# Patient Record
Sex: Female | Born: 1940
Health system: Southern US, Community
[De-identification: ages and names within clinical notes are randomized; demographics above are authoritative.]

## PROBLEM LIST (undated history)

## (undated) DIAGNOSIS — I890 Lymphedema, not elsewhere classified: Secondary | ICD-10-CM

## (undated) DIAGNOSIS — J189 Pneumonia, unspecified organism: Secondary | ICD-10-CM

## (undated) DIAGNOSIS — E785 Hyperlipidemia, unspecified: Secondary | ICD-10-CM

## (undated) DIAGNOSIS — Z9889 Other specified postprocedural states: Secondary | ICD-10-CM

## (undated) DIAGNOSIS — G4730 Sleep apnea, unspecified: Secondary | ICD-10-CM

## (undated) DIAGNOSIS — K759 Inflammatory liver disease, unspecified: Secondary | ICD-10-CM

## (undated) DIAGNOSIS — Z972 Presence of dental prosthetic device (complete) (partial): Secondary | ICD-10-CM

## (undated) DIAGNOSIS — R112 Nausea with vomiting, unspecified: Secondary | ICD-10-CM

## (undated) DIAGNOSIS — I10 Essential (primary) hypertension: Secondary | ICD-10-CM

## (undated) DIAGNOSIS — J441 Chronic obstructive pulmonary disease with (acute) exacerbation: Secondary | ICD-10-CM

## (undated) DIAGNOSIS — IMO0001 Reserved for inherently not codable concepts without codable children: Secondary | ICD-10-CM

## (undated) DIAGNOSIS — I272 Pulmonary hypertension, unspecified: Secondary | ICD-10-CM

## (undated) DIAGNOSIS — R011 Cardiac murmur, unspecified: Secondary | ICD-10-CM

## (undated) DIAGNOSIS — J9600 Acute respiratory failure, unspecified whether with hypoxia or hypercapnia: Secondary | ICD-10-CM

## (undated) HISTORY — PX: EYE SURGERY: SHX253

## (undated) HISTORY — PX: ABDOMINAL HYSTERECTOMY: SHX81

## (undated) HISTORY — PX: APPENDECTOMY: SHX54

## (undated) HISTORY — PX: BREAST SURGERY: SHX581

## (undated) HISTORY — PX: TONSILLECTOMY: SUR1361

---

## 2005-02-28 ENCOUNTER — Ambulatory Visit: Payer: Self-pay | Admitting: Internal Medicine

## 2005-07-11 ENCOUNTER — Ambulatory Visit: Payer: Self-pay | Admitting: Unknown Physician Specialty

## 2006-05-13 ENCOUNTER — Ambulatory Visit: Payer: Self-pay | Admitting: Internal Medicine

## 2007-05-17 ENCOUNTER — Ambulatory Visit: Payer: Self-pay | Admitting: Internal Medicine

## 2011-11-17 ENCOUNTER — Ambulatory Visit: Payer: Self-pay | Admitting: Internal Medicine

## 2012-11-17 ENCOUNTER — Ambulatory Visit: Payer: Self-pay | Admitting: Internal Medicine

## 2013-03-10 ENCOUNTER — Ambulatory Visit: Payer: Self-pay | Admitting: Ophthalmology

## 2013-03-11 ENCOUNTER — Ambulatory Visit: Payer: Self-pay | Admitting: Ophthalmology

## 2013-07-18 ENCOUNTER — Ambulatory Visit: Payer: Self-pay | Admitting: Ophthalmology

## 2014-04-19 DIAGNOSIS — R7309 Other abnormal glucose: Secondary | ICD-10-CM | POA: Insufficient documentation

## 2014-11-20 ENCOUNTER — Ambulatory Visit
Admit: 2014-11-20 | Disposition: A | Discharge status: Home / Self Care | Payer: Self-pay | Attending: Internal Medicine | Admitting: Internal Medicine

## 2014-11-24 NOTE — Op Note (Signed)
PATIENT NAME:  Vanessa DolinMANESS, Dona Y MR#:  [id number] DATE OF BIRTH:  Sep 27, 1940  DATE OF PROCEDURE:  03/11/2013  PREOPERATIVE DIAGNOSIS: Rhegmatogenous retinal detachment of the right eye.   POSTOPERATIVE DIAGNOSIS: Rhegmatogenous retinal detachment of the right eye.   PROCEDURES PERFORMED:  1.  Pars plana vitrectomy of the right eye.  2.  Endolaser of the right eye.  3.  Gas exchange of the right eye.  4.  Retinal detachment repair of the right eye.   PRIMARY SURGEON:  Aron BabaMatthew Shan Padgett, MD  ESTIMATED BLOOD LOSS: Less than 1 mL.  ANESTHESIA: Retrobulbar block of the right eye with monitored anesthesia care.   COMPLICATIONS: None.   ESTIMATED BLOOD LOSS: Less than 1 mL.   INDICATION FOR PROCEDURE: This is a patient who presented to my office with loss of vision in her left eye 2 days prior. Examination revealed a macula off rhegmatogenous retinal detachment of the right eye. Risks, benefits and alternatives of the above procedure were discussed and the patient wished to proceed.   DETAILS OF PROCEDURE: After informed consent was obtained, the patient was brought into the operative suite at [facility]. The patient was placed in the supine position and was given a small dose of Alfenta and a retrobulbar block was performed on the right eye by the primary surgeon without any complications. The right eye was prepped and draped in sterile manner. After lid speculum was inserted, a 25-gauge trocar was placed inferotemporally through displaced conjunctiva in an oblique fashion 4 mm beyond the limbus. Infusion cannula was turned on and inserted through the trocar and secured into position with Steri-Strips. Two more trocars were placed in a similar fashion superotemporally and superonasally. The vitreous cutter and light pipe were introduced into the eye and a core vitrectomy was performed. Peripheral vitreous was trimmed for 360 degrees with special care taken over the retinal  detachment in order to avoid incarceration or cuts of the retina. Only a single retinal break could be identified at approximately 11 o'clock. This was marked using endo cautery. Once the peripheral vitreous was trimmed for 360 degrees with care taken to avoid hitting the crystalline lens, posterior draining retinotomy was created at approximately 10:30. An air-fluid exchange was performed through this draining retinotomy and the retina completely flattened. Four rows of laser were placed around the peripheral retinal tear and 3 to 4 rows of laser was carried for 360 degrees, from the ora serrata posteriorly. Once this was completed, remnant fluid was removed from the posterior and through the draining retinotomy. Four rows of laser were placed around the draining retinotomy. 22% SF6 was used as an air gas exchange and the trocars were removed. Two of the trocar sites were closed using 6-0 plain gut. All the wounds were noted to be airtight, and the eye was pressurized to 15 mmHg with the SF6. 5 mg of dexamethasone was given into the inferior fornix, and the lid speculum was removed. The eye was cleaned and TobraDex was placed on the eye. A patch and shield were placed over the eye, and the patient was taken to postanesthesia care with instructions to remain head up with a left tilt.  ____________________________ Ignacia FellingMatthew F. Champ MungoAppenzeller, MD mfa:sb D: 03/11/2013 08:18:50 ET T: 03/11/2013 08:58:23 ET JOB#: [id number]  cc: Ignacia FellingMatthew F. Champ MungoAppenzeller, MD, <Dictator> Cline CoolsMATTHEW F Wynell Halberg MD ELECTRONICALLY SIGNED 03/23/2013 8:48

## 2015-01-03 ENCOUNTER — Inpatient Hospital Stay
Admission: AD | Admit: 2015-01-03 | Discharge: 2015-01-06 | DRG: 189 | Disposition: A | Discharge status: Home / Self Care | Payer: PPO | Source: Ambulatory Visit | Attending: Internal Medicine | Admitting: Internal Medicine

## 2015-01-03 ENCOUNTER — Encounter: Payer: Self-pay | Admitting: Internal Medicine

## 2015-01-03 ENCOUNTER — Inpatient Hospital Stay: Payer: PPO

## 2015-01-03 DIAGNOSIS — J9600 Acute respiratory failure, unspecified whether with hypoxia or hypercapnia: Secondary | ICD-10-CM | POA: Diagnosis present

## 2015-01-03 DIAGNOSIS — I272 Other secondary pulmonary hypertension: Secondary | ICD-10-CM | POA: Diagnosis present

## 2015-01-03 DIAGNOSIS — I10 Essential (primary) hypertension: Secondary | ICD-10-CM | POA: Diagnosis present

## 2015-01-03 DIAGNOSIS — R000 Tachycardia, unspecified: Secondary | ICD-10-CM | POA: Diagnosis present

## 2015-01-03 DIAGNOSIS — Z8249 Family history of ischemic heart disease and other diseases of the circulatory system: Secondary | ICD-10-CM | POA: Diagnosis not present

## 2015-01-03 DIAGNOSIS — J441 Chronic obstructive pulmonary disease with (acute) exacerbation: Secondary | ICD-10-CM | POA: Insufficient documentation

## 2015-01-03 DIAGNOSIS — G4730 Sleep apnea, unspecified: Secondary | ICD-10-CM | POA: Diagnosis present

## 2015-01-03 DIAGNOSIS — Z87891 Personal history of nicotine dependence: Secondary | ICD-10-CM | POA: Diagnosis not present

## 2015-01-03 DIAGNOSIS — E785 Hyperlipidemia, unspecified: Secondary | ICD-10-CM | POA: Diagnosis present

## 2015-01-03 DIAGNOSIS — R0600 Dyspnea, unspecified: Secondary | ICD-10-CM

## 2015-01-03 HISTORY — DX: Chronic obstructive pulmonary disease with (acute) exacerbation: J44.1

## 2015-01-03 HISTORY — DX: Essential (primary) hypertension: I10

## 2015-01-03 HISTORY — DX: Acute respiratory failure, unspecified whether with hypoxia or hypercapnia: J96.00

## 2015-01-03 HISTORY — DX: Hyperlipidemia, unspecified: E78.5

## 2015-01-03 HISTORY — DX: Other specified postprocedural states: R11.2

## 2015-01-03 HISTORY — DX: Cardiac murmur, unspecified: R01.1

## 2015-01-03 HISTORY — DX: Pneumonia, unspecified organism: J18.9

## 2015-01-03 HISTORY — DX: Sleep apnea, unspecified: G47.30

## 2015-01-03 HISTORY — DX: Pulmonary hypertension, unspecified: I27.20

## 2015-01-03 HISTORY — DX: Other specified postprocedural states: Z98.890

## 2015-01-03 HISTORY — DX: Inflammatory liver disease, unspecified: K75.9

## 2015-01-03 HISTORY — DX: Reserved for inherently not codable concepts without codable children: IMO0001

## 2015-01-03 LAB — URINALYSIS COMPLETE WITH MICROSCOPIC (ARMC ONLY)
BACTERIA UA: NONE SEEN
Bilirubin Urine: NEGATIVE
GLUCOSE, UA: NEGATIVE mg/dL
KETONES UR: NEGATIVE mg/dL
NITRITE: NEGATIVE
Protein, ur: NEGATIVE mg/dL
SPECIFIC GRAVITY, URINE: 1.008 (ref 1.005–1.030)
pH: 6 (ref 5.0–8.0)

## 2015-01-03 LAB — COMPREHENSIVE METABOLIC PANEL
ALT: 19 U/L (ref 14–54)
AST: 23 U/L (ref 15–41)
Albumin: 4.2 g/dL (ref 3.5–5.0)
Alkaline Phosphatase: 81 U/L (ref 38–126)
Anion gap: 12 (ref 5–15)
BILIRUBIN TOTAL: 0.6 mg/dL (ref 0.3–1.2)
BUN: 14 mg/dL (ref 6–20)
CALCIUM: 10.3 mg/dL (ref 8.9–10.3)
CHLORIDE: 94 mmol/L — AB (ref 101–111)
CO2: 31 mmol/L (ref 22–32)
CREATININE: 0.79 mg/dL (ref 0.44–1.00)
GFR calc non Af Amer: >60 mL/min (ref >60)
Glucose, Bld: 114 mg/dL — ABNORMAL HIGH (ref 65–99)
Potassium: 3.4 mmol/L — ABNORMAL LOW (ref 3.5–5.1)
Sodium: 137 mmol/L (ref 135–145)
TOTAL PROTEIN: 8 g/dL (ref 6.5–8.1)

## 2015-01-03 LAB — CBC
HCT: 39.9 % (ref 35.0–47.0)
HEMOGLOBIN: 13.4 g/dL (ref 12.0–16.0)
MCH: 31.2 pg (ref 26.0–34.0)
MCHC: 33.5 g/dL (ref 32.0–36.0)
MCV: 93.3 fL (ref 80.0–100.0)
Platelets: 291 10*3/uL (ref 150–440)
RBC: 4.28 MIL/uL (ref 3.80–5.20)
RDW: 12.5 % (ref 11.5–14.5)
WBC: 7.7 10*3/uL (ref 3.6–11.0)

## 2015-01-03 LAB — TROPONIN I: Troponin I: <0.03 ng/mL (ref <0.031)

## 2015-01-03 MED ORDER — ONDANSETRON HCL 4 MG/2ML IJ SOLN: 4.0000 mg | Freq: Four times a day (QID) | INTRAMUSCULAR | Status: DC | PRN

## 2015-01-03 MED ORDER — PRAVASTATIN SODIUM 20 MG PO TABS
20.0000 mg | ORAL_TABLET | Freq: Every day | ORAL | Status: DC
  Administered 2015-01-03 – 2015-01-05 (×3): 20 mg via ORAL
  Filled 2015-01-03 (×3): qty 1

## 2015-01-03 MED ORDER — SODIUM CHLORIDE 0.9 % IV SOLN
INTRAVENOUS | Status: DC
  Administered 2015-01-03: 19:00:00 via INTRAVENOUS

## 2015-01-03 MED ORDER — IPRATROPIUM BROMIDE 0.02 % IN SOLN
0.5000 mg | Freq: Four times a day (QID) | RESPIRATORY_TRACT | Status: DC
  Administered 2015-01-03 – 2015-01-05 (×6): 0.5 mg via RESPIRATORY_TRACT
  Filled 2015-01-03 (×6): qty 2.5

## 2015-01-03 MED ORDER — ONDANSETRON HCL 4 MG PO TABS: 4.0000 mg | ORAL_TABLET | Freq: Four times a day (QID) | ORAL | Status: DC | PRN

## 2015-01-03 MED ORDER — BUDESONIDE-FORMOTEROL FUMARATE 160-4.5 MCG/ACT IN AERO
2.0000 | INHALATION_SPRAY | Freq: Two times a day (BID) | RESPIRATORY_TRACT | Status: DC
  Administered 2015-01-06: 09:00:00 2 via RESPIRATORY_TRACT
  Filled 2015-01-03: qty 6

## 2015-01-03 MED ORDER — METHYLPREDNISOLONE SODIUM SUCC 40 MG IJ SOLR
40.0000 mg | Freq: Two times a day (BID) | INTRAMUSCULAR | Status: DC
  Administered 2015-01-03 – 2015-01-05 (×4): 40 mg via INTRAVENOUS
  Filled 2015-01-03 (×4): qty 1

## 2015-01-03 MED ORDER — LOSARTAN POTASSIUM 50 MG PO TABS
25.0000 mg | ORAL_TABLET | Freq: Every day | ORAL | Status: DC
  Administered 2015-01-03 – 2015-01-06 (×4): 25 mg via ORAL
  Filled 2015-01-03 (×4): qty 1

## 2015-01-03 MED ORDER — ACETAMINOPHEN 650 MG RE SUPP: 650.0000 mg | Freq: Four times a day (QID) | RECTAL | Status: DC | PRN

## 2015-01-03 MED ORDER — HEPARIN SODIUM (PORCINE) 5000 UNIT/ML IJ SOLN
5000.0000 [IU] | Freq: Three times a day (TID) | INTRAMUSCULAR | Status: DC
  Administered 2015-01-03 – 2015-01-06 (×9): 5000 [IU] via SUBCUTANEOUS
  Filled 2015-01-03 (×9): qty 1

## 2015-01-03 MED ORDER — DILTIAZEM HCL 60 MG PO TABS
30.0000 mg | ORAL_TABLET | Freq: Three times a day (TID) | ORAL | Status: DC
  Administered 2015-01-03 – 2015-01-05 (×6): 30 mg via ORAL
  Administered 2015-01-05: 60 mg via ORAL
  Administered 2015-01-05 – 2015-01-06 (×2): 30 mg via ORAL
  Filled 2015-01-03 (×9): qty 1

## 2015-01-03 MED ORDER — DEXTROSE 5 % IV SOLN
500.0000 mg | INTRAVENOUS | Status: DC
  Administered 2015-01-03: 500 mg via INTRAVENOUS
  Filled 2015-01-03 (×2): qty 500

## 2015-01-03 MED ORDER — DOCUSATE SODIUM 100 MG PO CAPS
100.0000 mg | ORAL_CAPSULE | Freq: Two times a day (BID) | ORAL | Status: DC
  Administered 2015-01-03 – 2015-01-06 (×3): 100 mg via ORAL
  Filled 2015-01-03 (×5): qty 1

## 2015-01-03 MED ORDER — ASPIRIN EC 81 MG PO TBEC
81.0000 mg | DELAYED_RELEASE_TABLET | Freq: Every day | ORAL | Status: DC
  Administered 2015-01-04 – 2015-01-06 (×3): 81 mg via ORAL
  Filled 2015-01-03 (×3): qty 1

## 2015-01-03 MED ORDER — ACETAMINOPHEN 325 MG PO TABS
650.0000 mg | ORAL_TABLET | Freq: Four times a day (QID) | ORAL | Status: DC | PRN
  Administered 2015-01-03 – 2015-01-05 (×6): 650 mg via ORAL
  Filled 2015-01-03 (×7): qty 2

## 2015-01-03 MED ORDER — SODIUM CHLORIDE 0.9 % IJ SOLN
3.0000 mL | Freq: Two times a day (BID) | INTRAMUSCULAR | Status: DC
  Administered 2015-01-03 – 2015-01-06 (×6): 3 mL via INTRAVENOUS

## 2015-01-03 MED ORDER — ALBUTEROL SULFATE (2.5 MG/3ML) 0.083% IN NEBU
2.5000 mg | INHALATION_SOLUTION | Freq: Four times a day (QID) | RESPIRATORY_TRACT | Status: DC
  Administered 2015-01-04 – 2015-01-05 (×4): 2.5 mg via RESPIRATORY_TRACT
  Filled 2015-01-03 (×5): qty 3

## 2015-01-03 MED ORDER — TRAZODONE HCL 50 MG PO TABS: 25.0000 mg | ORAL_TABLET | Freq: Every evening | ORAL | Status: DC | PRN

## 2015-01-03 NOTE — H&P (Signed)
Vanessa Rowe is an 74 y.o. female.   Chief Complaint: dyspnea HPI: pt with h/o COPD, presents with 1-2 days increased cough and Congestion, with nasal drainage, fevers at home. Developed some headache as well. Cough with minimal sputum. Develop progressive wheezing. Presented to the office for further evaluation, where oxygen saturation was found to be 75% on room air, improving to 92% on 2 L. Exam with diffuse wheezing. Admitted now with acute respiratory failure, with acute COPD exacerbation.  Past Medical History  Diagnosis Date  . COPD exacerbation   . Hyperlipidemia   . Pulmonary hypertension   . Acute respiratory failure     Past Surgical History  Procedure Laterality Date  . Appendectomy    . Abdominal hysterectomy      Family History  Problem Relation Age of Onset  . Coronary artery disease Father   . Coronary artery disease Mother    Social History:  reports that she has quit smoking. She does not have any smokeless tobacco history on file. She reports that she drinks alcohol. Her drug history is not on file.  Allergies:  Allergies  Allergen Reactions  . Macrodantin [Nitrofurantoin Macrocrystal] Other (See Comments)  . Penicillins Other (See Comments)    No prescriptions prior to admission    No results found for this or any previous visit (from the past 48 hour(s)). No results found.  ROS  Wt 140 lb, HR 107, BP 162/80  General: Then elderly female, appears short of breath HEENT: PERRL, EOMI, OP Dry without lesions Neck: supple, trachea midline; no thyromegaly Chest: normal to palpation Lungs: Poor airflow throughout with diffuse wheezing Cardiac: Tachycardic without murmurs, gallops, rubs; distal pulses 2+ Abd: Soft, nontender, nondistended, positive bowel sounds.  No organomegaly Ext: No clubbing, cyanosis. Trace ankle edema Neuro: CN grossly intact. Moves all extremities Derm: no significant rashes or nodules Lymph: no cervical or supraclavicular  nodes  Assessment/Plan 1. Acute COPD exacerbation/acute respiratory failure. Administered steroids, inhaled medications, and. Antibiotics. Chest x-ray today. 2. Pulmonary hypertension.  hold diuretic 3. Hypertension. Reduce losartan dose. Add Cardizem given tachycardia. Follow heart rate, blood pressure. 4. Hyperlipidemia. Continue statin therapy.  Curtis SitesBERT J KLEIN III 01/03/2015, 2:33 PM

## 2015-01-04 ENCOUNTER — Encounter: Payer: Self-pay | Admitting: Internal Medicine

## 2015-01-04 LAB — CBC
HCT: 37.1 % (ref 35.0–47.0)
Hemoglobin: 12.7 g/dL (ref 12.0–16.0)
MCH: 31.8 pg (ref 26.0–34.0)
MCHC: 34.2 g/dL (ref 32.0–36.0)
MCV: 92.9 fL (ref 80.0–100.0)
Platelets: 273 K/uL (ref 150–440)
RBC: 3.99 MIL/uL (ref 3.80–5.20)
RDW: 12.4 % (ref 11.5–14.5)
WBC: 7.2 K/uL (ref 3.6–11.0)

## 2015-01-04 LAB — BASIC METABOLIC PANEL WITH GFR
Anion gap: 10 (ref 5–15)
BUN: 12 mg/dL (ref 6–20)
CO2: 30 mmol/L (ref 22–32)
Calcium: 9.9 mg/dL (ref 8.9–10.3)
Chloride: 94 mmol/L — ABNORMAL LOW (ref 101–111)
Creatinine, Ser: 0.71 mg/dL (ref 0.44–1.00)
GFR calc Af Amer: >60 mL/min
GFR calc non Af Amer: >60 mL/min
Glucose, Bld: 176 mg/dL — ABNORMAL HIGH (ref 65–99)
Potassium: 3.5 mmol/L (ref 3.5–5.1)
Sodium: 134 mmol/L — ABNORMAL LOW (ref 135–145)

## 2015-01-04 LAB — TROPONIN I: Troponin I: <0.03 ng/mL

## 2015-01-04 LAB — GLUCOSE, CAPILLARY
Glucose-Capillary: 183 mg/dL — ABNORMAL HIGH (ref 65–99)
Glucose-Capillary: 233 mg/dL — ABNORMAL HIGH (ref 65–99)

## 2015-01-04 MED ORDER — LEVOFLOXACIN 500 MG PO TABS
500.0000 mg | ORAL_TABLET | Freq: Every day | ORAL | Status: DC
  Administered 2015-01-04 – 2015-01-06 (×3): 500 mg via ORAL
  Filled 2015-01-04 (×3): qty 1

## 2015-01-04 MED ORDER — INSULIN ASPART 100 UNIT/ML ~~LOC~~ SOLN
0.0000 [IU] | Freq: Two times a day (BID) | SUBCUTANEOUS | Status: DC
  Administered 2015-01-04: 21:00:00 8 [IU] via SUBCUTANEOUS
  Administered 2015-01-04 – 2015-01-06 (×4): 4 [IU] via SUBCUTANEOUS
  Filled 2015-01-04 (×2): qty 4
  Filled 2015-01-04: qty 8
  Filled 2015-01-04 (×2): qty 4

## 2015-01-04 MED ORDER — HYDROCOD POLST-CPM POLST ER 10-8 MG/5ML PO SUER
5.0000 mL | Freq: Two times a day (BID) | ORAL | Status: DC | PRN
Start: 2015-01-04 — End: 2015-01-06
  Administered 2015-01-04 – 2015-01-05 (×4): 5 mL via ORAL
  Filled 2015-01-04 (×4): qty 5

## 2015-01-04 NOTE — Evaluation (Signed)
Physical Therapy Evaluation Patient Details Name: Vanessa Rowe MRN: [id number] DOB: [birth year] Today's Date: 01/04/2015   History of Present Illness  presented to ER from PCP secondary to 1-2 days of cough/congestion; admitted with acute respiratory failure secondary to COPD exacerbation.  Clinical Impression  Upon evaluation, patient alert and oriented to all information.  Pleasant and cooperative; good insight and safety awareness.  Strength and ROM grossly WFL and symmetrical throughout; mild/mod edema throughout LEs (R > L).  Able to complete all mobility without assist device, distant sup/mod indep.  Mod deficits in cardiopulmonary endurance noted, with desat to 84% on 2L supplemental O2.  Mod cuing/encouragement for activity pacing and energy conservation. Would benefit from 1-2 short-term skilled PT visits during hospitalization to address deficits in cardiopulmonary endurance and promote optimal return to PLOF.  No skilled PT follow up needed upon discharge; may benefit from referral to outpatient pulmonary rehab program.    Follow Up Recommendations No PT follow up (May benefit from outpatient pulmonary rehab referral)    Equipment Recommendations       Recommendations for Other Services       Precautions / Restrictions Precautions Precautions: Fall Restrictions Weight Bearing Restrictions: No      Mobility  Bed Mobility               General bed mobility comments: sitting edge of bed beginning/end of session  Transfers Overall transfer level: Modified independent Equipment used: None             General transfer comment: Sit/stand and basic transfers without assist device  Ambulation/Gait Ambulation/Gait assistance: Supervision Ambulation Distance (Feet): 200 Feet Assistive device: None     Gait velocity interpretation: <1.8 ft/sec, indicative of risk for recurrent falls (0.6 ft/sec) General Gait Details: reciprocal stepping pattern with good step  height/length; good trunk rotation and arm swing.  Able to complete dynamic gait components without LOB.  Stairs            Wheelchair Mobility    Modified Rankin (Stroke Patients Only)       Balance Overall balance assessment: Modified Independent                                           Pertinent Vitals/Pain Pain Assessment: No/denies pain    Home Living Family/patient expects to be discharged to:: Private residence Living Arrangements: Alone Available Help at Discharge: Family Type of Home: House Home Access: Stairs to enter Entrance Stairs-Rails: Right Entrance Stairs-Number of Steps: 3 Home Layout: One level Home Equipment: None      Prior Function Level of Independence: Independent         Comments: Indep with all household/community activities, + driving.  Denies fall history.  No home O2.     Hand Dominance        Extremity/Trunk Assessment   Upper Extremity Assessment: Overall WFL for tasks assessed           Lower Extremity Assessment: Overall WFL for tasks assessed      Cervical / Trunk Assessment: Normal  Communication   Communication: No difficulties  Cognition Arousal/Alertness: Awake/alert Behavior During Therapy: WFL for tasks assessed/performed Overall Cognitive Status: Within Functional Limits for tasks assessed                      General Comments General comments (skin integrity, edema, etc.):  mild/mod edema throughout distal LEs (+2 pitting), R > L    Exercises Other Exercises Other Exercises: Educated in techniques and need for activity pacing and energy conservation; patient voiced understanding.  Encouraged mobilization around unit 2-3x/day with family/staff to promote continued cardiopulmonary endurance training/endurance. (8 minutes)      Assessment/Plan    PT Assessment Patient needs continued PT services  PT Diagnosis Generalized weakness   PT Problem List Cardiopulmonary status  limiting activity  PT Treatment Interventions Gait training;Other (comment) (cardiopulmonary endurance training)   PT Goals (Current goals can be found in the Care Plan section) Acute Rehab PT Goals Patient Stated Goal: "to go home" PT Goal Formulation: With patient/family Time For Goal Achievement: 01/11/15 Potential to Achieve Goals: Good Additional Goals Additional Goal #1: Patient will demonstrate indep use of activity pacing/energy conservation techniques for optimal oxygenation with activity.    Frequency Min 2X/week   Barriers to discharge        Co-evaluation               End of Session Equipment Utilized During Treatment: Gait belt Activity Tolerance: Patient tolerated treatment well Patient left: in bed;with call bell/phone within reach;with family/visitor present Nurse Communication: Mobility status         Time: 1610-9604 PT Time Calculation (min) (ACUTE ONLY): 14 min   Charges:   PT Evaluation $Initial PT Evaluation Tier I: 1 Procedure PT Treatments $Therapeutic Activity: 8-22 mins   PT G Codes:        Zaydenn Balaguer H. Manson Passey, PT, DPT 01/04/2015, 9:56 AM [phone]

## 2015-01-04 NOTE — Progress Notes (Signed)
Patient ID: Vanessa Rowe, female   DOB: [birth year], 74 y.o.   MRN: [id number] SUBJECTIVE:  Admitted with acute resp failure due to COPD exacerbation.  Feels better this AM, though still wheezing.  C/o significant cough; some sputum, with sample sent to lab.  No fever, chills; no chest pain overnight.  HR and BP better overnight.    ______________________________________________________________________  ROS: Please see HPI; remainder of complete 10 point ROS is negative   Past Medical History  Diagnosis Date  . COPD exacerbation   . Hyperlipidemia   . Pulmonary hypertension   . Acute respiratory failure   . PONV (postoperative nausea and vomiting)   . Hypertension   . Heart murmur     when she was 74 years old  . Sleep apnea   . Shortness of breath dyspnea   . Pneumonia   . Hepatitis     Hepatitis A (in the 70s)    Past Surgical History  Procedure Laterality Date  . Appendectomy    . Abdominal hysterectomy    . Tonsillectomy    . Breast surgery      bilateral implants  . Eye surgery Right     detached retina and lens and cataract Surgery     Current facility-administered medications:  .  acetaminophen (TYLENOL) tablet 650 mg, 650 mg, Oral, Q6H PRN, 650 mg at 01/04/15 0511 **OR** acetaminophen (TYLENOL) suppository 650 mg, 650 mg, Rectal, Q6H PRN, Curtis Sites III, MD .  albuterol (PROVENTIL) (2.5 MG/3ML) 0.083% nebulizer solution 2.5 mg, 2.5 mg, Nebulization, Q6H, Curtis Sites III, MD, 2.5 mg at 01/04/15 0725 .  aspirin EC tablet 81 mg, 81 mg, Oral, Daily, Curtis Sites III, MD, 81 mg at 01/03/15 1643 .  budesonide-formoterol (SYMBICORT) 160-4.5 MCG/ACT inhaler 2 puff, 2 puff, Inhalation, BID, Curtis Sites III, MD, 2 puff at 01/04/15 0016 .  chlorpheniramine-HYDROcodone (TUSSIONEX) 10-8 MG/5ML suspension 5 mL, 5 mL, Oral, Q12H PRN, Curtis Sites III, MD .  diltiazem (CARDIZEM) tablet 30 mg, 30 mg, Oral, 3 times per day, Curtis Sites III, MD, 30 mg at 01/04/15 0511 .   docusate sodium (COLACE) capsule 100 mg, 100 mg, Oral, BID, Curtis Sites III, MD, 100 mg at 01/03/15 2258 .  heparin injection 5,000 Units, 5,000 Units, Subcutaneous, 3 times per day, Curtis Sites III, MD, 5,000 Units at 01/04/15 [phone] .  insulin aspart (novoLOG) injection 0-24 Units, 0-24 Units, Subcutaneous, BID, Curtis Sites III, MD .  ipratropium (ATROVENT) nebulizer solution 0.5 mg, 0.5 mg, Nebulization, Q6H, Curtis Sites III, MD, 0.5 mg at 01/04/15 0726 .  levofloxacin (LEVAQUIN) tablet 500 mg, 500 mg, Oral, Daily, Curtis Sites III, MD .  losartan (COZAAR) tablet 25 mg, 25 mg, Oral, Daily, Curtis Sites III, MD, 25 mg at 01/03/15 1646 .  methylPREDNISolone sodium succinate (SOLU-MEDROL) 40 mg/mL injection 40 mg, 40 mg, Intravenous, Q12H, Curtis Sites III, MD, 40 mg at 01/04/15 5621 .  ondansetron (ZOFRAN) tablet 4 mg, 4 mg, Oral, Q6H PRN **OR** ondansetron (ZOFRAN) injection 4 mg, 4 mg, Intravenous, Q6H PRN, Curtis Sites III, MD .  pravastatin (PRAVACHOL) tablet 20 mg, 20 mg, Oral, q1800, Curtis Sites III, MD, 20 mg at 01/03/15 1835 .  sodium chloride 0.9 % injection 3 mL, 3 mL, Intravenous, Q12H, Curtis Sites III, MD, 3 mL at 01/03/15 1835 .  traZODone (DESYREL) tablet 25 mg, 25 mg, Oral, QHS PRN, Curtis Sites III,  MD  PHYSICAL EXAM:  BP 136/67 mmHg  Pulse 74  Temp(Src) 97.7 F (36.5 C) (Oral)  Resp 20  Ht 5\' 1"  (1.549 m)  Wt 62.37 kg (137 lb 8 oz)  BMI 25.99 kg/m2  SpO2 98%  General: thin elderly  female, with audible wheezing HEENT: PERRL; OP dry without lesions. Neck: supple, trachea midline, no thyromegaly Chest: normal to palpation Lungs: decreased airflow with bilateral wheezes; no retractions.  Airflow slightly improved from yesterday Cardiovascular: RRR, no murmur, no gallop; distal pulses 2+ Abdomen: soft, nontender, nondistended, quiet bowel sounds Extremities: no clubbing, cyanosis, edema Neuro: alert and oriented, moves all extremities Derm: no significant rashes or  nodules; good skin turgor Lymph: no cervical or supraclavicular lymphadenopathy  Labs and imaging studies were reviewed  ASSESSMENT/PLAN:   1. Acute resp failure/acute copd exacerbation- slight improvement; cont steroids and inhaled medications.  Mobilize as able.  No pneumonia on CXR.  Sputum cx pending.  Add cough suppressant; side effects discussed. 2. UTI- u/a c/w UTI; adjust abx to levaquin.  Send cx. 3. HTN- improved; will d/c telemetry.  Cont current treatment

## 2015-01-04 NOTE — Plan of Care (Signed)
Problem: Discharge Progression Outcomes Goal: Other Discharge Outcomes/Goals Outcome: Progressing Plan of care progress: COPD: -continues SVN treatments with improvement in wheezing  -IV solumedrol continues  -dyspnea on exertion, improves at rest, breathing techniques utilized -o2 in place at 2L [place] -remains on PO ABTs -no complaints of pain, no distress or discomfort noted

## 2015-01-04 NOTE — Progress Notes (Signed)
Pt remain alert and verbally responsive, ambulates with assist to bathroom ,Iv fluids infusing without difficulty, no adverse reaction noted from abt therapy.No c/o pain or discomfort at this time

## 2015-01-04 NOTE — Care Management (Signed)
Direct admit from PCP office for exac of COPD.  Her 02 sat in the office was 75%.  It is reported that patient does not have chronic home 02

## 2015-01-05 LAB — CBC
HEMATOCRIT: 33.3 % — AB (ref 35.0–47.0)
Hemoglobin: 11.4 g/dL — ABNORMAL LOW (ref 12.0–16.0)
MCH: 31.8 pg (ref 26.0–34.0)
MCHC: 34.2 g/dL (ref 32.0–36.0)
MCV: 92.9 fL (ref 80.0–100.0)
PLATELETS: 315 10*3/uL (ref 150–440)
RBC: 3.58 MIL/uL — ABNORMAL LOW (ref 3.80–5.20)
RDW: 12.3 % (ref 11.5–14.5)
WBC: 16.1 10*3/uL — ABNORMAL HIGH (ref 3.6–11.0)

## 2015-01-05 LAB — BASIC METABOLIC PANEL
Anion gap: 10 (ref 5–15)
BUN: 20 mg/dL (ref 6–20)
CHLORIDE: 93 mmol/L — AB (ref 101–111)
CO2: 30 mmol/L (ref 22–32)
Calcium: 9.8 mg/dL (ref 8.9–10.3)
Creatinine, Ser: 0.92 mg/dL (ref 0.44–1.00)
Glucose, Bld: 192 mg/dL — ABNORMAL HIGH (ref 65–99)
POTASSIUM: 3.6 mmol/L (ref 3.5–5.1)
SODIUM: 133 mmol/L — AB (ref 135–145)

## 2015-01-05 LAB — GLUCOSE, CAPILLARY
GLUCOSE-CAPILLARY: 172 mg/dL — AB (ref 65–99)
Glucose-Capillary: 165 mg/dL — ABNORMAL HIGH (ref 65–99)

## 2015-01-05 MED ORDER — PREDNISONE 10 MG (21) PO TBPK
10.0000 mg | ORAL_TABLET | ORAL | Status: AC
  Administered 2015-01-05: 17:00:00 10 mg via ORAL

## 2015-01-05 MED ORDER — TIOTROPIUM BROMIDE MONOHYDRATE 18 MCG IN CAPS
18.0000 ug | ORAL_CAPSULE | Freq: Every day | RESPIRATORY_TRACT | Status: DC
  Administered 2015-01-05 – 2015-01-06 (×2): 18 ug via RESPIRATORY_TRACT
  Filled 2015-01-05: qty 5

## 2015-01-05 MED ORDER — PREDNISONE 10 MG (21) PO TBPK
20.0000 mg | ORAL_TABLET | Freq: Every morning | ORAL | Status: AC
  Administered 2015-01-05: 08:00:00 20 mg via ORAL
  Filled 2015-01-05: qty 21

## 2015-01-05 MED ORDER — PREDNISONE 10 MG (21) PO TBPK: 10.0000 mg | ORAL_TABLET | Freq: Four times a day (QID) | ORAL | Status: DC

## 2015-01-05 MED ORDER — PREDNISONE 10 MG (21) PO TBPK: 20.0000 mg | ORAL_TABLET | Freq: Every evening | ORAL | Status: DC

## 2015-01-05 MED ORDER — PREDNISONE 10 MG (21) PO TBPK
10.0000 mg | ORAL_TABLET | ORAL | Status: AC
  Administered 2015-01-05: 13:00:00 10 mg via ORAL

## 2015-01-05 MED ORDER — PREDNISONE 10 MG (21) PO TBPK
10.0000 mg | ORAL_TABLET | Freq: Three times a day (TID) | ORAL | Status: DC
  Administered 2015-01-06: 09:00:00 10 mg via ORAL

## 2015-01-05 MED ORDER — PREDNISONE 10 MG (21) PO TBPK
20.0000 mg | ORAL_TABLET | Freq: Every evening | ORAL | Status: AC
  Administered 2015-01-05: 22:00:00 20 mg via ORAL

## 2015-01-05 NOTE — Plan of Care (Signed)
Problem: Discharge Progression Outcomes Goal: Other Discharge Outcomes/Goals Outcome: Progressing Plan of care progress: COPD: -continues SVN treatments with improvement in wheezing   -PO prednisone started -dyspnea on exertion, improves at rest, breathing techniques utilized -o2 in place at 2L [place], 90% at rest on room air, 86% with exertion on room air this morning, recovers with o2 -remains on PO ABTs -no complaints of pain, no distress or discomfort noted

## 2015-01-05 NOTE — Progress Notes (Signed)
Patient ID: Vanessa Rowe, female   DOB: Nov 28, 1940, 74 y.o.   MRN: [id number] Vanessa Rowe is an 74 y.o. female.   Chief Complaint: dyspnea HPI: pt with h/o COPD, presents with 1-2 days increased cough and Congestion, with nasal drainage, fevers at home. Developed some headache as well. Cough with minimal sputum. Develop progressive wheezing. Presented to the office for further evaluation, where oxygen saturation was found to be 75% on room air, improving to 92% on 2 L. Exam with diffuse wheezing. Admitted. with acute respiratory failure, with acute COPD exacerbation. Markedly improved with nebulizers and IV steroids. Complains of being excessively jittery  Past Medical History  Diagnosis Date  . COPD exacerbation   . Hyperlipidemia   . Pulmonary hypertension   . Acute respiratory failure   . PONV (postoperative nausea and vomiting)   . Hypertension   . Heart murmur     when she was 74 years old  . Sleep apnea   . Shortness of breath dyspnea   . Pneumonia   . Hepatitis     Hepatitis A (in the 70s)    Past Surgical History  Procedure Laterality Date  . Appendectomy    . Abdominal hysterectomy    . Tonsillectomy    . Breast surgery      bilateral implants  . Eye surgery Right     detached retina and lens and cataract Surgery    Family History  Problem Relation Age of Onset  . Coronary artery disease Father   . Coronary artery disease Mother    Social History:  reports that she quit smoking about 2 years ago. Her smoking use included Cigarettes. She smoked 1.00 pack per day. She has never used smokeless tobacco. She reports that she drinks about 4.2 oz of alcohol per week. She reports that she does not use illicit drugs.  Allergies:  Allergies  Allergen Reactions  . Symbicort [Budesonide-Formoterol Fumarate] Shortness Of Breath  . Macrodantin [Nitrofurantoin Macrocrystal] Other (See Comments)    Pt states that this medication put her in kidney failure.    Marland Kitchen  Penicillins Rash    Medications Prior to Admission  Medication Sig Dispense Refill  . albuterol (PROVENTIL HFA;VENTOLIN HFA) 108 (90 BASE) MCG/ACT inhaler Inhale 2 puffs into the lungs every 4 (four) hours as needed for wheezing or shortness of breath.    Marland Kitchen aspirin EC 81 MG tablet Take 81 mg by mouth daily.    . furosemide (LASIX) 40 MG tablet Take 40 mg by mouth daily as needed for edema.    Marland Kitchen guaiFENesin (ROBITUSSIN) 100 MG/5ML liquid Take 200 mg by mouth 3 (three) times daily as needed for cough.    . losartan-hydrochlorothiazide (HYZAAR) 100-25 MG per tablet Take 1 tablet by mouth daily.    Marland Kitchen lovastatin (MEVACOR) 20 MG tablet Take 20 mg by mouth at bedtime.    . potassium chloride (K-DUR,KLOR-CON) 10 MEQ tablet Take 10 mEq by mouth daily as needed (when taking Furosemide).    Marland Kitchen tiotropium (SPIRIVA) 18 MCG inhalation capsule Place 18 mcg into inhaler and inhale daily.      Results for orders placed or performed during the hospital encounter of 01/03/15 (from the past 48 hour(s))  CBC     Status: None   Collection Time: 01/03/15  3:20 PM  Result Value Ref Range   WBC 7.7 3.6 - 11.0 K/uL   RBC 4.28 3.80 - 5.20 MIL/uL   Hemoglobin 13.4 12.0 - 16.0 g/dL  HCT 39.9 35.0 - 47.0 %   MCV 93.3 80.0 - 100.0 fL   MCH 31.2 26.0 - 34.0 pg   MCHC 33.5 32.0 - 36.0 g/dL   RDW 12.5 11.5 - 14.5 %   Platelets 291 150 - 440 K/uL  Comprehensive metabolic panel     Status: Abnormal   Collection Time: 01/03/15  3:20 PM  Result Value Ref Range   Sodium 137 135 - 145 mmol/L   Potassium 3.4 (L) 3.5 - 5.1 mmol/L   Chloride 94 (L) 101 - 111 mmol/L   CO2 31 22 - 32 mmol/L   Glucose, Bld 114 (H) 65 - 99 mg/dL   BUN 14 6 - 20 mg/dL   Creatinine, Ser 0.79 0.44 - 1.00 mg/dL   Calcium 10.3 8.9 - 10.3 mg/dL   Total Protein 8.0 6.5 - 8.1 g/dL   Albumin 4.2 3.5 - 5.0 g/dL   AST 23 15 - 41 U/L   ALT 19 14 - 54 U/L   Alkaline Phosphatase 81 38 - 126 U/L   Total Bilirubin 0.6 0.3 - 1.2 mg/dL   GFR calc non  Af Amer >60 >60 mL/min   GFR calc Af Amer >60 >60 mL/min    Comment: (NOTE) The eGFR has been calculated using the CKD EPI equation. This calculation has not been validated in all clinical situations. eGFR's persistently <60 mL/min signify possible Chronic Kidney Disease.    Anion gap 12 5 - 15  Troponin I     Status: None   Collection Time: 01/03/15  3:20 PM  Result Value Ref Range   Troponin I <0.03 <0.031 ng/mL    Comment:        NO INDICATION OF MYOCARDIAL INJURY.   Urinalysis complete, with microscopic [facility])     Status: Abnormal   Collection Time: 01/03/15  4:00 PM  Result Value Ref Range   Color, Urine STRAW (A) YELLOW   APPearance CLEAR (A) CLEAR   Glucose, UA NEGATIVE NEGATIVE mg/dL   Bilirubin Urine NEGATIVE NEGATIVE   Ketones, ur NEGATIVE NEGATIVE mg/dL   Specific Gravity, Urine 1.008 1.005 - 1.030   Hgb urine dipstick 1+ (A) NEGATIVE   pH 6.0 5.0 - 8.0   Protein, ur NEGATIVE NEGATIVE mg/dL   Nitrite NEGATIVE NEGATIVE   Leukocytes, UA 3+ (A) NEGATIVE   RBC / HPF 6-30 0 - 5 RBC/hpf   WBC, UA TOO NUMEROUS TO COUNT 0 - 5 WBC/hpf   Bacteria, UA NONE SEEN NONE SEEN   Squamous Epithelial / LPF 0-5 (A) NONE SEEN   Mucous PRESENT   Culture, blood (routine x 2)     Status: None (Preliminary result)   Collection Time: 01/03/15  4:41 PM  Result Value Ref Range   Specimen Description BLOOD    Special Requests NONE    Culture NO GROWTH < 24 HOURS    Report Status PENDING   Culture, blood (routine x 2)     Status: None (Preliminary result)   Collection Time: 01/03/15  4:41 PM  Result Value Ref Range   Specimen Description BLOOD    Special Requests NONE    Culture NO GROWTH < 24 HOURS    Report Status PENDING   Troponin I     Status: None   Collection Time: 01/03/15  8:24 PM  Result Value Ref Range   Troponin I <0.03 <0.031 ng/mL    Comment:        NO INDICATION OF MYOCARDIAL INJURY.   Troponin  I     Status: None   Collection Time: 01/04/15  3:04 AM  Result  Value Ref Range   Troponin I <0.03 <0.031 ng/mL    Comment:        NO INDICATION OF MYOCARDIAL INJURY.   Basic metabolic panel     Status: Abnormal   Collection Time: 01/04/15  3:04 AM  Result Value Ref Range   Sodium 134 (L) 135 - 145 mmol/L   Potassium 3.5 3.5 - 5.1 mmol/L   Chloride 94 (L) 101 - 111 mmol/L   CO2 30 22 - 32 mmol/L   Glucose, Bld 176 (H) 65 - 99 mg/dL   BUN 12 6 - 20 mg/dL   Creatinine, Ser 3.18 0.44 - 1.00 mg/dL   Calcium 9.9 8.9 - 66.6 mg/dL   GFR calc non Af Amer >60 >60 mL/min   GFR calc Af Amer >60 >60 mL/min    Comment: (NOTE) The eGFR has been calculated using the CKD EPI equation. This calculation has not been validated in all clinical situations. eGFR's persistently <60 mL/min signify possible Chronic Kidney Disease.    Anion gap 10 5 - 15  CBC     Status: None   Collection Time: 01/04/15  3:04 AM  Result Value Ref Range   WBC 7.2 3.6 - 11.0 K/uL   RBC 3.99 3.80 - 5.20 MIL/uL   Hemoglobin 12.7 12.0 - 16.0 g/dL   HCT 28.8 35.5 - 14.2 %   MCV 92.9 80.0 - 100.0 fL   MCH 31.8 26.0 - 34.0 pg   MCHC 34.2 32.0 - 36.0 g/dL   RDW 95.7 08.1 - 64.6 %   Platelets 273 150 - 440 K/uL  Glucose, capillary     Status: Abnormal   Collection Time: 01/04/15  7:57 AM  Result Value Ref Range   Glucose-Capillary 183 (H) 65 - 99 mg/dL  Glucose, capillary     Status: Abnormal   Collection Time: 01/04/15  8:41 PM  Result Value Ref Range   Glucose-Capillary 233 (H) 65 - 99 mg/dL  Basic metabolic panel     Status: Abnormal   Collection Time: 01/05/15  5:30 AM  Result Value Ref Range   Sodium 133 (L) 135 - 145 mmol/L   Potassium 3.6 3.5 - 5.1 mmol/L   Chloride 93 (L) 101 - 111 mmol/L   CO2 30 22 - 32 mmol/L   Glucose, Bld 192 (H) 65 - 99 mg/dL   BUN 20 6 - 20 mg/dL   Creatinine, Ser 7.25 0.44 - 1.00 mg/dL   Calcium 9.8 8.9 - 91.2 mg/dL   GFR calc non Af Amer >60 >60 mL/min   GFR calc Af Amer >60 >60 mL/min    Comment: (NOTE) The eGFR has been calculated using  the CKD EPI equation. This calculation has not been validated in all clinical situations. eGFR's persistently <60 mL/min signify possible Chronic Kidney Disease.    Anion gap 10 5 - 15  CBC     Status: Abnormal   Collection Time: 01/05/15  5:30 AM  Result Value Ref Range   WBC 16.1 (H) 3.6 - 11.0 K/uL   RBC 3.58 (L) 3.80 - 5.20 MIL/uL   Hemoglobin 11.4 (L) 12.0 - 16.0 g/dL   HCT 64.4 (L) 55.2 - 95.8 %   MCV 92.9 80.0 - 100.0 fL   MCH 31.8 26.0 - 34.0 pg   MCHC 34.2 32.0 - 36.0 g/dL   RDW 94.2 36.6 - 17.3 %  Platelets 315 150 - 440 K/uL   X-ray Chest Pa And Lateral  01/03/2015   CLINICAL DATA:  h/o COPD, presents with 1-2 days increased cough and Congestion, with nasal drainage, fevers at home. Developed some headache as well. Cough with minimal sputum. Develop progressive wheezing. Presented to the office for further evaluation, where oxygen saturation was found to be 75% on room air, improving to 92% on 2 L. Exam with diffuse wheezing. Admitted now with acute respiratory failure, with acute COPD exacerbation  EXAM: CHEST - 2 VIEW  COMPARISON:  None available  FINDINGS: Lungs mildly hyperinflated with somewhat coarse attenuated bronchovascular markings. No focal infiltrate or overt edema. Heart size normal. No pneumothorax. No effusion. Visualized skeletal structures are unremarkable.  IMPRESSION: No acute cardiopulmonary disease.   Electronically Signed   By: Lucrezia Europe M.D.   On: 01/03/2015 18:23    ROS  Wt 140 lb, HR 107, BP 162/80  General: Then elderly female, appears short of breath HEENT: PERRL, EOMI, OP Dry without lesions Neck: supple, trachea midline; no thyromegaly Chest: normal to palpation Lungs: Poor airflow throughout with diffuse wheezing Cardiac: Tachycardic without murmurs, gallops, rubs; distal pulses 2+ Abd: Soft, nontender, nondistended, positive bowel sounds.  No organomegaly Ext: No clubbing, cyanosis. Trace ankle edema Neuro: CN grossly intact. Moves all  extremities Derm: no significant rashes or nodules Lymph: no cervical or supraclavicular nodes  Assessment/Plan 1. Acute COPD exacerbation/acute respiratory failure. Improved. DC Solu-Medrol. Start prednisone taper. DC nebulizers. Start back on Spiriva. 2. Pulmonary hypertension.  hold diuretic 3. Hypertension. Reduce losartan dose. Add Cardizem given tachycardia. Follow heart rate, blood pressure. 4. Hyperlipidemia. Continue statin therapy.  WALKER III, Vanessa Rowe B 01/05/2015, 7:21 AM

## 2015-01-05 NOTE — Progress Notes (Signed)
Inpatient Diabetes Program Recommendations  AACE/ADA: New Consensus Statement on Inpatient Glycemic Control (2013)  Target Ranges:  Prepandial:   less than 140 mg/dL      Peak postprandial:   less than 180 mg/dL (1-2 hours)      Critically ill patients:  140 - 180 mg/dL   Results for Vanessa Rowe, Vanessa Rowe (MRN [id number]) as of 01/05/2015 09:24  Ref. Range 01/04/2015 07:57 01/04/2015 20:41 01/05/2015 08:08  Glucose-Capillary Latest Ref Range: 65-99 mg/dL 829183 (H) 562233 (H) 130172 (H)    Diabetes history: No Outpatient Diabetes medications: NA Current orders for Inpatient glycemic control: Novolog 0-24 units BID  Inpatient Diabetes Program Recommendations Correction (SSI): Currently ordered Novolog 0-24 units BID. Please discontinue current Novolog order and use regular Glycemic Control order set to order Novolog resistant scale (0-20 units) ACHS while inpatient and ordered steroids.  Thanks, Orlando PennerMarie Madelyn Tlatelpa, RN, MSN, CCRN, CDE Diabetes Coordinator Inpatient Diabetes Program [phone] (Team Pager from 8am to 5pm) [phone] ([facility] office) [phone] [facility] office) [phone] [facility] office)

## 2015-01-05 NOTE — Plan of Care (Signed)
Problem: Discharge Progression Outcomes Goal: Discharge plan in place and appropriate Individualization: Pt prefers to be called Vanessa Rowe who lives at home w/ son. Hx COPD, HTN, & Hyperlipidemia controlled by home medications.  Pt is a moderate fall risk due to being on oxygen but is otherwise very independent. Goal: Other Discharge Outcomes/Goals Plan of care progress to goals: 1. Headache r/t coughing relieved by PRN Tylenol. Coughing relieved by PRN Tussionix. 2. Hemodynamically:              -VSS, remains of 2L O2 via [place] with stable saturations             -continues SVN treatments & IV Solumedrol with improvement in wheezing               -PO Abx 3. Pt is independent but has dyspnea on exertion, improves at rest, breathing techniques utilized No fall/injury this shift. Will continue to assess.

## 2015-01-06 LAB — GLUCOSE, CAPILLARY: Glucose-Capillary: 179 mg/dL — ABNORMAL HIGH (ref 65–99)

## 2015-01-06 LAB — URINE CULTURE

## 2015-01-06 MED ORDER — ALBUTEROL SULFATE (2.5 MG/3ML) 0.083% IN NEBU: 2.5000 mg | INHALATION_SOLUTION | Freq: Four times a day (QID) | RESPIRATORY_TRACT | Status: DC | PRN

## 2015-01-06 MED ORDER — ALBUTEROL SULFATE HFA 108 (90 BASE) MCG/ACT IN AERS: 2.0000 | INHALATION_SPRAY | Freq: Four times a day (QID) | RESPIRATORY_TRACT | Status: DC | PRN

## 2015-01-06 MED ORDER — LEVOFLOXACIN 500 MG PO TABS: 500.0000 mg | ORAL_TABLET | Freq: Every day | ORAL | Status: DC

## 2015-01-06 NOTE — Plan of Care (Signed)
Problem: Discharge Progression Outcomes Goal: Other Discharge Outcomes/Goals Outcome: Progressing Plan of care, progress to goal: Patient 02 93% on room air, Patient ambulates without difficulty in room,

## 2015-01-06 NOTE — Discharge Instructions (Signed)
Hyperventilation °Hyperventilation is breathing that is deeper and more rapid than normal. It is usually associated with panic and anxiety. Hyperventilation can make you feel breathless. It is sometimes called overbreathing. Breathing out too much causes a decrease in the amount of carbon dioxide gas in the blood. This leads to tingling and numbness in the hands, feet, and around the mouth. If this continues, your fingers, hands, and toes may begin to spasm. Hyperventilation usually lasts 20-30 minutes and can be associated with other symptoms of panic and anxiety, including:  °· Chest pains or tightness. °· A pounding or irregular, racing heartbeat (palpitations). °· Dizziness. °· Lightheadedness. °· Dry mouth. °· Weakness. °· Confusion. °· Sleep disturbance. °CAUSES  °Sudden onset (acute) hyperventilation is usually triggered by acute stress, anxiety, or emotional upset. Long-term (chronic) and recurring hyperventilation can occur with chronic lung problems, such emphysema or asthma. Other causes include:  °· Nervousness. °· Stress. °· Stimulant, drug, or alcohol use. °· Lung disease. °· Infections, such as pneumonia. °· Heart problems. °· Severe pain. °· Waking from a bad dream. °· Pregnancy. °· Bleeding. °HOME CARE INSTRUCTIONS °· Learn and use breathing exercises that help you breathe from your diaphragm and abdomen. °· Practice relaxation techniques to reduce stress, such as visualization, meditation, and muscle release. °· During an attack, try breathing into a paper bag. This changes the carbon dioxide level and slows down breathing. °SEEK IMMEDIATE MEDICAL CARE IF: °· Your hyperventilation continues or gets worse. °MAKE SURE YOU: °· Understand these instructions. °· Will watch your condition. °· Will get help right away if you are not doing well or get worse. °Document Released: 07/18/2000 Document Revised: 01/20/2012 Document Reviewed: 10/30/2011 °ExitCare® Patient Information ©2015 [employer]. This  information is not intended to replace advice given to you by your health care provider. Make sure you discuss any questions you have with your health care provider. ° °

## 2015-01-06 NOTE — Discharge Summary (Signed)
Vanessa Rowe, is a 74 y.o. female  DOB 06-Mar-1941  MRN [id number].  Admission date:  01/03/2015  Admitting Physician  Lynnea FerrierBert J Klein III, MD  Discharge Date:  01/06/2015    Admission Diagnosis  Acute COPD Exacerbation  Discharge Diagnoses   Acute COPD Exacerbation  Principal Problem:   COPD exacerbation Active Problems:   Pulmonary hypertension   Acute respiratory failure   Hyperlipidemia      Past Medical History  Diagnosis Date  . COPD exacerbation   . Hyperlipidemia   . Pulmonary hypertension   . Acute respiratory failure   . PONV (postoperative nausea and vomiting)   . Hypertension   . Heart murmur     when she was 74 years old  . Sleep apnea   . Shortness of breath dyspnea   . Pneumonia   . Hepatitis     Hepatitis A (in the 70s)    Past Surgical History  Procedure Laterality Date  . Appendectomy    . Abdominal hysterectomy    . Tonsillectomy    . Breast surgery      bilateral implants  . Eye surgery Right     detached retina and lens and cataract Surgery       History of present illness and  Hospital Course:     Kindly see H&P for history of present illness and admission details, please review complete Labs, Consult reports and Test reports for all details in brief  HPI  from the history and physical done on the day of admission    Hospital Course   She was admitted to the regular hospital floor she was treated with SVNs, IV steroids, and IV antibodies. Her condition improved rapidly and she was switched to oral prednisone briefly. At the time of discharge her pulse ox was normal on room air. Chest x-ray showed no evidence of pneumonia. He was treated with oral Cardizem for tachycardia during her acute exacerbation. Was discontinued prior to discharge. Patient was to discharged on her routine medications without change. Will be continued on Levaquin for an additional 5  days.   Discharge Condition: Good   Follow UP  Patient will follow-up with Dr. Graciela HusbandsKlein in approximately 1 week.   Discharge Instructions  and  Discharge Medications   . aspirin EC  81 mg Oral Daily  . docusate sodium  100 mg Oral BID  . levofloxacin  500 mg Oral Daily  . losartan  25 mg Oral Daily  . pravastatin  20 mg Oral q1800  . predniSONE  20 mg Oral Nightly  . tiotropium  18 mcg Inhalation Daily   albuterol, chlorpheniramine-HYDROcodone, traZODone   Today   Subjective:   Vanessa Rowe today has no headache,no chest abdominal pain,no new weakness tingling or numbness, feels much better wants to go home today.   Objective:   Blood pressure 139/80, pulse 83, temperature 97.8 F (36.6 C), temperature source Oral, resp. rate 18, height 5\' 2"  (1.575 m), weight 64.774 kg (142 lb 12.8 oz), SpO2  96 %.   Exam Awake Alert, Oriented x 3, No new F.N deficits, Normal affect [place].AT,PERRAL Supple Neck,No JVD, No cervical lymphadenopathy appriciated.  Symmetrical Chest wall movement, Good air movement bilaterally, CTAB RRR,No Gallops,Rubs or new Murmurs, Abd Soft, Non tender, No rebound. No Cyanosis, Clubbing or edema, No new Rash or bruise  Total Time in preparing paper work, data evaluation and todays exam - 35 minutes  Jodi Mourning B M.D on 01/06/2015 at 11:25 AM

## 2015-01-08 LAB — CULTURE, BLOOD (ROUTINE X 2)
Culture: NO GROWTH
Culture: NO GROWTH

## 2015-01-19 LAB — EXPECTORATED SPUTUM ASSESSMENT W REFEX TO RESP CULTURE

## 2015-01-19 LAB — EXPECTORATED SPUTUM ASSESSMENT W GRAM STAIN, RFLX TO RESP C

## 2015-08-14 DIAGNOSIS — J449 Chronic obstructive pulmonary disease, unspecified: Secondary | ICD-10-CM | POA: Diagnosis not present

## 2015-08-24 DIAGNOSIS — R0789 Other chest pain: Secondary | ICD-10-CM | POA: Diagnosis not present

## 2015-08-24 DIAGNOSIS — Z043 Encounter for examination and observation following other accident: Secondary | ICD-10-CM | POA: Diagnosis not present

## 2015-09-11 DIAGNOSIS — R609 Edema, unspecified: Secondary | ICD-10-CM | POA: Diagnosis not present

## 2015-09-11 DIAGNOSIS — I10 Essential (primary) hypertension: Secondary | ICD-10-CM | POA: Diagnosis not present

## 2015-09-11 DIAGNOSIS — E785 Hyperlipidemia, unspecified: Secondary | ICD-10-CM | POA: Diagnosis not present

## 2015-09-11 DIAGNOSIS — J449 Chronic obstructive pulmonary disease, unspecified: Secondary | ICD-10-CM | POA: Diagnosis not present

## 2015-10-09 DIAGNOSIS — J449 Chronic obstructive pulmonary disease, unspecified: Secondary | ICD-10-CM | POA: Diagnosis not present

## 2015-10-11 DIAGNOSIS — J449 Chronic obstructive pulmonary disease, unspecified: Secondary | ICD-10-CM | POA: Diagnosis not present

## 2015-11-09 DIAGNOSIS — J449 Chronic obstructive pulmonary disease, unspecified: Secondary | ICD-10-CM | POA: Diagnosis not present

## 2015-11-11 DIAGNOSIS — J449 Chronic obstructive pulmonary disease, unspecified: Secondary | ICD-10-CM | POA: Diagnosis not present

## 2015-12-09 DIAGNOSIS — J449 Chronic obstructive pulmonary disease, unspecified: Secondary | ICD-10-CM | POA: Diagnosis not present

## 2015-12-11 DIAGNOSIS — J449 Chronic obstructive pulmonary disease, unspecified: Secondary | ICD-10-CM | POA: Diagnosis not present

## 2015-12-18 DIAGNOSIS — R7309 Other abnormal glucose: Secondary | ICD-10-CM | POA: Diagnosis not present

## 2015-12-18 DIAGNOSIS — I10 Essential (primary) hypertension: Secondary | ICD-10-CM | POA: Diagnosis not present

## 2015-12-18 DIAGNOSIS — I272 Other secondary pulmonary hypertension: Secondary | ICD-10-CM | POA: Diagnosis not present

## 2015-12-18 DIAGNOSIS — G4733 Obstructive sleep apnea (adult) (pediatric): Secondary | ICD-10-CM | POA: Diagnosis not present

## 2015-12-24 DIAGNOSIS — G4733 Obstructive sleep apnea (adult) (pediatric): Secondary | ICD-10-CM | POA: Diagnosis not present

## 2015-12-24 DIAGNOSIS — E784 Other hyperlipidemia: Secondary | ICD-10-CM | POA: Diagnosis not present

## 2015-12-24 DIAGNOSIS — J9611 Chronic respiratory failure with hypoxia: Secondary | ICD-10-CM | POA: Insufficient documentation

## 2015-12-24 DIAGNOSIS — Z1211 Encounter for screening for malignant neoplasm of colon: Secondary | ICD-10-CM | POA: Diagnosis not present

## 2015-12-24 DIAGNOSIS — R7309 Other abnormal glucose: Secondary | ICD-10-CM | POA: Diagnosis not present

## 2015-12-24 DIAGNOSIS — I272 Other secondary pulmonary hypertension: Secondary | ICD-10-CM | POA: Diagnosis not present

## 2015-12-24 DIAGNOSIS — I10 Essential (primary) hypertension: Secondary | ICD-10-CM | POA: Diagnosis not present

## 2015-12-24 DIAGNOSIS — Z0000 Encounter for general adult medical examination without abnormal findings: Secondary | ICD-10-CM | POA: Diagnosis not present

## 2015-12-24 DIAGNOSIS — J439 Emphysema, unspecified: Secondary | ICD-10-CM | POA: Diagnosis not present

## 2016-01-09 DIAGNOSIS — Z1211 Encounter for screening for malignant neoplasm of colon: Secondary | ICD-10-CM | POA: Diagnosis not present

## 2016-01-09 DIAGNOSIS — J449 Chronic obstructive pulmonary disease, unspecified: Secondary | ICD-10-CM | POA: Diagnosis not present

## 2016-01-11 DIAGNOSIS — J449 Chronic obstructive pulmonary disease, unspecified: Secondary | ICD-10-CM | POA: Diagnosis not present

## 2016-02-08 DIAGNOSIS — J449 Chronic obstructive pulmonary disease, unspecified: Secondary | ICD-10-CM | POA: Diagnosis not present

## 2016-02-10 DIAGNOSIS — J449 Chronic obstructive pulmonary disease, unspecified: Secondary | ICD-10-CM | POA: Diagnosis not present

## 2016-03-10 DIAGNOSIS — J449 Chronic obstructive pulmonary disease, unspecified: Secondary | ICD-10-CM | POA: Diagnosis not present

## 2016-03-12 DIAGNOSIS — J449 Chronic obstructive pulmonary disease, unspecified: Secondary | ICD-10-CM | POA: Diagnosis not present

## 2016-04-10 DIAGNOSIS — J449 Chronic obstructive pulmonary disease, unspecified: Secondary | ICD-10-CM | POA: Diagnosis not present

## 2016-04-12 DIAGNOSIS — J449 Chronic obstructive pulmonary disease, unspecified: Secondary | ICD-10-CM | POA: Diagnosis not present

## 2016-05-10 DIAGNOSIS — J449 Chronic obstructive pulmonary disease, unspecified: Secondary | ICD-10-CM | POA: Diagnosis not present

## 2016-05-12 DIAGNOSIS — J449 Chronic obstructive pulmonary disease, unspecified: Secondary | ICD-10-CM | POA: Diagnosis not present

## 2016-06-10 DIAGNOSIS — J449 Chronic obstructive pulmonary disease, unspecified: Secondary | ICD-10-CM | POA: Diagnosis not present

## 2016-06-12 DIAGNOSIS — J449 Chronic obstructive pulmonary disease, unspecified: Secondary | ICD-10-CM | POA: Diagnosis not present

## 2016-06-18 DIAGNOSIS — G4733 Obstructive sleep apnea (adult) (pediatric): Secondary | ICD-10-CM | POA: Diagnosis not present

## 2016-06-18 DIAGNOSIS — R7309 Other abnormal glucose: Secondary | ICD-10-CM | POA: Diagnosis not present

## 2016-06-18 DIAGNOSIS — I272 Pulmonary hypertension, unspecified: Secondary | ICD-10-CM | POA: Diagnosis not present

## 2016-06-18 DIAGNOSIS — J9611 Chronic respiratory failure with hypoxia: Secondary | ICD-10-CM | POA: Diagnosis not present

## 2016-06-18 DIAGNOSIS — I10 Essential (primary) hypertension: Secondary | ICD-10-CM | POA: Diagnosis not present

## 2016-06-25 DIAGNOSIS — I10 Essential (primary) hypertension: Secondary | ICD-10-CM | POA: Diagnosis not present

## 2016-06-25 DIAGNOSIS — I272 Pulmonary hypertension, unspecified: Secondary | ICD-10-CM | POA: Diagnosis not present

## 2016-06-25 DIAGNOSIS — Z0000 Encounter for general adult medical examination without abnormal findings: Secondary | ICD-10-CM | POA: Diagnosis not present

## 2016-06-25 DIAGNOSIS — J9611 Chronic respiratory failure with hypoxia: Secondary | ICD-10-CM | POA: Diagnosis not present

## 2016-06-25 DIAGNOSIS — G4733 Obstructive sleep apnea (adult) (pediatric): Secondary | ICD-10-CM | POA: Diagnosis not present

## 2016-06-25 DIAGNOSIS — E784 Other hyperlipidemia: Secondary | ICD-10-CM | POA: Diagnosis not present

## 2016-06-25 DIAGNOSIS — R7309 Other abnormal glucose: Secondary | ICD-10-CM | POA: Diagnosis not present

## 2016-06-25 DIAGNOSIS — J439 Emphysema, unspecified: Secondary | ICD-10-CM | POA: Diagnosis not present

## 2016-07-10 DIAGNOSIS — J449 Chronic obstructive pulmonary disease, unspecified: Secondary | ICD-10-CM | POA: Diagnosis not present

## 2016-07-12 DIAGNOSIS — J449 Chronic obstructive pulmonary disease, unspecified: Secondary | ICD-10-CM | POA: Diagnosis not present

## 2016-12-18 DIAGNOSIS — I10 Essential (primary) hypertension: Secondary | ICD-10-CM | POA: Diagnosis not present

## 2016-12-18 DIAGNOSIS — R7309 Other abnormal glucose: Secondary | ICD-10-CM | POA: Diagnosis not present

## 2016-12-18 DIAGNOSIS — J439 Emphysema, unspecified: Secondary | ICD-10-CM | POA: Diagnosis not present

## 2016-12-18 DIAGNOSIS — J9611 Chronic respiratory failure with hypoxia: Secondary | ICD-10-CM | POA: Diagnosis not present

## 2016-12-25 DIAGNOSIS — R7309 Other abnormal glucose: Secondary | ICD-10-CM | POA: Diagnosis not present

## 2016-12-25 DIAGNOSIS — E784 Other hyperlipidemia: Secondary | ICD-10-CM | POA: Diagnosis not present

## 2016-12-25 DIAGNOSIS — J439 Emphysema, unspecified: Secondary | ICD-10-CM | POA: Diagnosis not present

## 2016-12-25 DIAGNOSIS — Z780 Asymptomatic menopausal state: Secondary | ICD-10-CM | POA: Diagnosis not present

## 2016-12-25 DIAGNOSIS — I10 Essential (primary) hypertension: Secondary | ICD-10-CM | POA: Diagnosis not present

## 2016-12-25 DIAGNOSIS — J9611 Chronic respiratory failure with hypoxia: Secondary | ICD-10-CM | POA: Diagnosis not present

## 2016-12-25 DIAGNOSIS — I272 Pulmonary hypertension, unspecified: Secondary | ICD-10-CM | POA: Diagnosis not present

## 2016-12-25 DIAGNOSIS — Z0000 Encounter for general adult medical examination without abnormal findings: Secondary | ICD-10-CM | POA: Diagnosis not present

## 2016-12-25 DIAGNOSIS — G4733 Obstructive sleep apnea (adult) (pediatric): Secondary | ICD-10-CM | POA: Diagnosis not present

## 2016-12-26 ENCOUNTER — Telehealth: Payer: Self-pay

## 2016-12-26 NOTE — Telephone Encounter (Signed)
lmov to  Schedule new patient consult appt per Dr[facility] Daniel NonesBert Klein [facility]SA pulm emphysema and Chronic RF

## 2017-01-19 ENCOUNTER — Institutional Professional Consult (permissible substitution): Payer: PPO | Admitting: Internal Medicine

## 2017-01-30 DIAGNOSIS — M8588 Other specified disorders of bone density and structure, other site: Secondary | ICD-10-CM | POA: Diagnosis not present

## 2017-02-27 ENCOUNTER — Encounter: Payer: Self-pay | Admitting: Internal Medicine

## 2017-02-27 ENCOUNTER — Ambulatory Visit (INDEPENDENT_AMBULATORY_CARE_PROVIDER_SITE_OTHER): Payer: Medicare HMO | Admitting: Internal Medicine

## 2017-02-27 VITALS — BP 124/80 | HR 82 | Ht 62.0 in | Wt 144.0 lb

## 2017-02-27 DIAGNOSIS — R0600 Dyspnea, unspecified: Secondary | ICD-10-CM | POA: Diagnosis not present

## 2017-02-27 DIAGNOSIS — J449 Chronic obstructive pulmonary disease, unspecified: Secondary | ICD-10-CM

## 2017-02-27 MED ORDER — FLUTICASONE FUROATE-VILANTEROL 200-25 MCG/INH IN AEPB: 1.0000 | INHALATION_SPRAY | Freq: Every day | RESPIRATORY_TRACT | 0 refills | Status: DC

## 2017-02-27 NOTE — Patient Instructions (Addendum)
Increase to BREO 200 Continue Spiriva as prescribed Albuterol as needed  Please call us back if you  decide to obtain 6 minute walk test overnight pulse oximetry and sleep study

## 2017-02-27 NOTE — Progress Notes (Signed)
Breo 200 [id number] NDC [id number] LOT 9/19

## 2017-02-27 NOTE — Progress Notes (Signed)
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, civil (consulting)nurse seen today for evaluation and assessment of COPD Patient has been diagnosed with COPD for many years Patient has chronic shortness of breath and dyspnea on exertion which she has been adapting to for quite some time Patient has intermittent wheezing and coughing episodes Patient is on Brio 100 and Spiriva inhaler therapy Her last albuterol use was several weeks ago Patient has not had prednisone in the last 6 months Patient has signs and symptoms of excessive daytime sleepiness and some snoring But no gasping for air at night Patient has some fatigue  Patient quit tobacco abuse 5 years ago Patient was on oxygen 2 years ago and was noted to be severely hypoxic  Patient doesn't feel like she needs oxygen at this time and does not want to be tested for it at this time Patient has no signs of infection at this time There is bilateral lower extremity edema at this time but no signs of acute CHF exacerbation No signs of infection at this time  I have discussed spirometry at results with patient and she has severe obstructive pulmonary disease   Patient refuses to wear oxygen if needed Patient refuses to obtain sleep study and refuses therapy if warranted Patient will think about it and let us know if she decides to obtain these tests       PAST MEDICAL HISTORY :   has a past medical history of Acute respiratory failure; COPD exacerbation; Heart murmur; Hepatitis; Hyperlipidemia; Hypertension; Pneumonia; PONV (postoperative nausea and vomiting); Pulmonary hypertension; Shortness of breath dyspnea; and Sleep apnea.  has a past surgical history  that includes Appendectomy; Abdominal hysterectomy; Tonsillectomy; Breast surgery; and Eye surgery (Right). Prior to Admission medications   Medication Sig Start Date End Date Taking? Authorizing Provider  albuterol (PROVENTIL HFA;VENTOLIN HFA) 108 (90 BASE) MCG/ACT inhaler Inhale 2 puffs into the lungs every 4 (four) hours as needed for wheezing or shortness of breath.    [provider]  aspirin EC 81 MG tablet Take 81 mg by mouth daily.    [provider]  furosemide (LASIX) 40 MG tablet Take 40 mg by mouth daily as needed for edema.    [provider]  guaiFENesin (ROBITUSSIN) 100 MG/5ML liquid Take 200 mg by mouth 3 (three) times daily as needed for cough.    [provider]  levofloxacin (LEVAQUIN) 500 MG tablet Take 1 tablet (500 mg total) by mouth daily. 01/06/15   Rafael BihariWalker, John B III, MD  losartan-hydrochlorothiazide (HYZAAR) 100-25 MG per tablet Take 1 tablet by mouth daily.    [provider]  lovastatin (MEVACOR) 20 MG tablet Take 20 mg by mouth at bedtime.    [provider]  potassium chloride (K-DUR,KLOR-CON) 10 MEQ tablet Take 10 mEq by mouth daily as needed (when taking Furosemide).    [provider]  tiotropium (SPIRIVA) 18 MCG inhalation capsule Place 18 mcg into inhaler and inhale daily.    [provider]   Allergies  Allergen Reactions  . Symbicort [Budesonide-Formoterol Fumarate] Shortness Of Breath  . Macrodantin [Nitrofurantoin Macrocrystal] Other (See Comments)    Pt states that this medication put her in kidney failure.    .Marland Kitchen  Penicillins Rash    FAMILY HISTORY:  family history includes Coronary artery disease in her father and mother. SOCIAL HISTORY:  reports that she quit smoking about 4 years ago. Her smoking use included Cigarettes. She smoked 1.00 pack per day. She has never used smokeless tobacco. She reports that she drinks about 4.2 oz of alcohol per week . She reports that she does not use  drugs.  REVIEW OF SYSTEMS:   Constitutional: Negative for fever, chills, weight loss, malaise/fatigue and diaphoresis.  HENT: Negative for hearing loss, ear pain, nosebleeds, congestion, sore throat, neck pain, tinnitus and ear discharge.   Eyes: Negative for blurred vision, double vision, photophobia, pain, discharge and redness.  Respiratory: Negative for cough, -hemoptysis, sputum production, + shortness of breath, + wheezing and stridor.   Cardiovascular: Negative for chest pain, palpitations, orthopnea, claudication, + leg swelling and PND.  Gastrointestinal: Negative for heartburn, nausea, vomiting, abdominal pain, diarrhea, constipation, blood in stool and melena.  Genitourinary: Negative for dysuria, urgency, frequency, hematuria and flank pain.  Musculoskeletal: Negative for myalgias, back pain, joint pain and falls.  Skin: Negative for itching and rash.  Neurological: Negative for dizziness, tingling, tremors, sensory change, speech change, focal weakness, seizures, loss of consciousness, weakness and headaches.  Endo/Heme/Allergies: Negative for environmental allergies and polydipsia. Does not bruise/bleed easily.  ALL OTHER ROS ARE NEGATIVE   BP 124/80 (BP Location: Left Arm, Cuff Size: Normal)   Pulse 82   Ht 5\' 2"  (1.575 m)   Wt 144 lb (65.3 kg)   SpO2 90%   BMI 26.34 kg/m    Physical Examination:   GENERAL:NAD, no fevers, chills, no weakness no fatigue HEAD: Normocephalic, atraumatic.  EYES: Pupils equal, round, reactive to light. Extraocular muscles intact. No scleral icterus.  MOUTH: Moist mucosal membrane.   EAR, NOSE, THROAT: Clear without exudates. No external lesions.  NECK: Supple. No thyromegaly. No nodules. No JVD.  PULMONARY:CTA B/L no wheezes, no crackles, no rhonchi CARDIOVASCULAR: S1 and S2. Regular rate and rhythm. No murmurs, rubs, or gallops. No edema.  GASTROINTESTINAL: Soft, nontender, nondistended. No masses. Positive bowel sounds.    MUSCULOSKELETAL: No swelling, clubbing, or edema. Range of motion full in all extremities.  NEUROLOGIC: Cranial nerves II through XII are intact. No gross focal neurological deficits.  SKIN: No ulceration, lesions, rashes, or cyanosis. Skin warm and dry. Turgor intact.  PSYCHIATRIC: Mood, affect within normal limits. The patient is awake, alert and oriented x 3. Insight, judgment intact.    Patient refuses to wear oxygen if needed Patient refuses to obtain sleep study and refuses therapy if warranted  Office spirometry today 02/27/17 revealed a ratio of 50% predicted With FEV1 0.6 L which is 33% predicted Interpretation of the study suggest very severe obstructive pulmonary disease   ASSESSMENT / PLAN: Patient refuses to wear oxygen if needed Patient refuses to obtain sleep study and refuses therapy if warranted Patient would like to think about it and call us back if she changes her mind about getting these tests   76 year old white female seen today for assessment of COPD and based on pulmonary function testing patient has very severe obstructive pulmonary disease with FEV1 of 33% predicted in the setting of chronic tobacco use patient has resting oxygen sats of 90% and if I were to do further testing she probably would be severely hypoxic upon exertion  At this time patient has severe end-stage COPD in the setting of deconditioned state with previous tobacco abuse  I have recommended that  she get overnight pulse oximetry along with the 6 minute walk test and also would recommend sleep study as patient's with COPD have overlap syndrome of 10-15% chance of sleep apnea   #1 shortness of breath and dyspnea on exertion related to her severe COPD and deconditioned state Recommend checking for hypoxia however patient does not want his tests at this time   #2 COPD severe Gold stage D Will increase her Breo dosage to 200 Continue Spiriva as prescribed Albuterol as needed No indication for  steroids at this time no indication for COPD exacerbation prevention at this time  #3 deconditioned state Recommend exercise as tolerated   Patient satisfied with Plan of action and management. All questions answered Follow-up in 6 months   Jozlynn Plaia Santiago Gladavid Mckaylin Bastien, M.D.  [facility]  Medical Director [facility] Medical Director [facility]

## 2017-06-29 DIAGNOSIS — I272 Pulmonary hypertension, unspecified: Secondary | ICD-10-CM | POA: Diagnosis not present

## 2017-06-29 DIAGNOSIS — J439 Emphysema, unspecified: Secondary | ICD-10-CM | POA: Diagnosis not present

## 2017-06-29 DIAGNOSIS — I10 Essential (primary) hypertension: Secondary | ICD-10-CM | POA: Diagnosis not present

## 2017-06-29 DIAGNOSIS — G4733 Obstructive sleep apnea (adult) (pediatric): Secondary | ICD-10-CM | POA: Diagnosis not present

## 2017-06-29 DIAGNOSIS — J9611 Chronic respiratory failure with hypoxia: Secondary | ICD-10-CM | POA: Diagnosis not present

## 2017-06-29 DIAGNOSIS — R7309 Other abnormal glucose: Secondary | ICD-10-CM | POA: Diagnosis not present

## 2017-06-29 DIAGNOSIS — E7849 Other hyperlipidemia: Secondary | ICD-10-CM | POA: Diagnosis not present

## 2017-07-03 DIAGNOSIS — R7309 Other abnormal glucose: Secondary | ICD-10-CM | POA: Diagnosis not present

## 2017-07-03 DIAGNOSIS — J439 Emphysema, unspecified: Secondary | ICD-10-CM | POA: Diagnosis not present

## 2017-07-03 DIAGNOSIS — I10 Essential (primary) hypertension: Secondary | ICD-10-CM | POA: Diagnosis not present

## 2017-07-03 DIAGNOSIS — I272 Pulmonary hypertension, unspecified: Secondary | ICD-10-CM | POA: Diagnosis not present

## 2017-07-03 DIAGNOSIS — E7849 Other hyperlipidemia: Secondary | ICD-10-CM | POA: Diagnosis not present

## 2017-07-03 DIAGNOSIS — J9611 Chronic respiratory failure with hypoxia: Secondary | ICD-10-CM | POA: Diagnosis not present

## 2017-07-03 DIAGNOSIS — G4733 Obstructive sleep apnea (adult) (pediatric): Secondary | ICD-10-CM | POA: Diagnosis not present

## 2017-09-16 ENCOUNTER — Telehealth: Payer: Self-pay | Admitting: Internal Medicine

## 2017-09-16 NOTE — Telephone Encounter (Signed)
Patient declined scheduling a fu with Dr. Belia HemanKasa and request recall be deleted she does not like the way he talked to her

## 2017-12-30 DIAGNOSIS — G4733 Obstructive sleep apnea (adult) (pediatric): Secondary | ICD-10-CM | POA: Diagnosis not present

## 2017-12-30 DIAGNOSIS — I272 Pulmonary hypertension, unspecified: Secondary | ICD-10-CM | POA: Diagnosis not present

## 2017-12-30 DIAGNOSIS — E7849 Other hyperlipidemia: Secondary | ICD-10-CM | POA: Diagnosis not present

## 2017-12-30 DIAGNOSIS — R7309 Other abnormal glucose: Secondary | ICD-10-CM | POA: Diagnosis not present

## 2017-12-30 DIAGNOSIS — I10 Essential (primary) hypertension: Secondary | ICD-10-CM | POA: Diagnosis not present

## 2018-01-06 DIAGNOSIS — Z0000 Encounter for general adult medical examination without abnormal findings: Secondary | ICD-10-CM | POA: Diagnosis not present

## 2018-01-06 DIAGNOSIS — E7849 Other hyperlipidemia: Secondary | ICD-10-CM | POA: Diagnosis not present

## 2018-01-06 DIAGNOSIS — J439 Emphysema, unspecified: Secondary | ICD-10-CM | POA: Diagnosis not present

## 2018-01-06 DIAGNOSIS — I272 Pulmonary hypertension, unspecified: Secondary | ICD-10-CM | POA: Diagnosis not present

## 2018-01-06 DIAGNOSIS — R600 Localized edema: Secondary | ICD-10-CM | POA: Diagnosis not present

## 2018-01-06 DIAGNOSIS — J9611 Chronic respiratory failure with hypoxia: Secondary | ICD-10-CM | POA: Diagnosis not present

## 2018-01-06 DIAGNOSIS — R7309 Other abnormal glucose: Secondary | ICD-10-CM | POA: Diagnosis not present

## 2018-01-06 DIAGNOSIS — G4733 Obstructive sleep apnea (adult) (pediatric): Secondary | ICD-10-CM | POA: Diagnosis not present

## 2018-01-06 DIAGNOSIS — I10 Essential (primary) hypertension: Secondary | ICD-10-CM | POA: Diagnosis not present

## 2018-01-13 DIAGNOSIS — J984 Other disorders of lung: Secondary | ICD-10-CM | POA: Diagnosis not present

## 2018-01-20 DIAGNOSIS — R7309 Other abnormal glucose: Secondary | ICD-10-CM | POA: Diagnosis not present

## 2018-01-20 DIAGNOSIS — I10 Essential (primary) hypertension: Secondary | ICD-10-CM | POA: Diagnosis not present

## 2018-01-20 DIAGNOSIS — J9611 Chronic respiratory failure with hypoxia: Secondary | ICD-10-CM | POA: Diagnosis not present

## 2018-01-20 DIAGNOSIS — R600 Localized edema: Secondary | ICD-10-CM | POA: Diagnosis not present

## 2018-01-20 DIAGNOSIS — I272 Pulmonary hypertension, unspecified: Secondary | ICD-10-CM | POA: Diagnosis not present

## 2018-01-20 DIAGNOSIS — G4733 Obstructive sleep apnea (adult) (pediatric): Secondary | ICD-10-CM | POA: Diagnosis not present

## 2018-01-20 DIAGNOSIS — J439 Emphysema, unspecified: Secondary | ICD-10-CM | POA: Diagnosis not present

## 2018-01-20 DIAGNOSIS — E7849 Other hyperlipidemia: Secondary | ICD-10-CM | POA: Diagnosis not present

## 2018-04-15 DIAGNOSIS — I10 Essential (primary) hypertension: Secondary | ICD-10-CM | POA: Diagnosis not present

## 2018-04-15 DIAGNOSIS — J9611 Chronic respiratory failure with hypoxia: Secondary | ICD-10-CM | POA: Diagnosis not present

## 2018-04-15 DIAGNOSIS — E7849 Other hyperlipidemia: Secondary | ICD-10-CM | POA: Diagnosis not present

## 2018-04-15 DIAGNOSIS — R7309 Other abnormal glucose: Secondary | ICD-10-CM | POA: Diagnosis not present

## 2018-05-03 DIAGNOSIS — R600 Localized edema: Secondary | ICD-10-CM | POA: Diagnosis not present

## 2018-05-03 DIAGNOSIS — Z23 Encounter for immunization: Secondary | ICD-10-CM | POA: Diagnosis not present

## 2018-05-03 DIAGNOSIS — G4733 Obstructive sleep apnea (adult) (pediatric): Secondary | ICD-10-CM | POA: Diagnosis not present

## 2018-05-03 DIAGNOSIS — E7849 Other hyperlipidemia: Secondary | ICD-10-CM | POA: Diagnosis not present

## 2018-05-03 DIAGNOSIS — I10 Essential (primary) hypertension: Secondary | ICD-10-CM | POA: Diagnosis not present

## 2018-05-03 DIAGNOSIS — J9611 Chronic respiratory failure with hypoxia: Secondary | ICD-10-CM | POA: Diagnosis not present

## 2018-05-03 DIAGNOSIS — I272 Pulmonary hypertension, unspecified: Secondary | ICD-10-CM | POA: Diagnosis not present

## 2018-05-03 DIAGNOSIS — R7309 Other abnormal glucose: Secondary | ICD-10-CM | POA: Diagnosis not present

## 2018-05-03 DIAGNOSIS — J439 Emphysema, unspecified: Secondary | ICD-10-CM | POA: Diagnosis not present

## 2018-05-17 DIAGNOSIS — R600 Localized edema: Secondary | ICD-10-CM | POA: Diagnosis not present

## 2018-06-04 ENCOUNTER — Other Ambulatory Visit: Payer: Self-pay | Admitting: Internal Medicine

## 2018-06-04 ENCOUNTER — Ambulatory Visit
Admission: RE | Admit: 2018-06-04 | Discharge: 2018-06-04 | Disposition: A | Discharge status: Home / Self Care | Payer: Medicare HMO | Source: Ambulatory Visit | Attending: Internal Medicine | Admitting: Internal Medicine

## 2018-06-04 DIAGNOSIS — R600 Localized edema: Secondary | ICD-10-CM

## 2018-06-04 DIAGNOSIS — I10 Essential (primary) hypertension: Secondary | ICD-10-CM | POA: Diagnosis not present

## 2018-06-04 DIAGNOSIS — G4733 Obstructive sleep apnea (adult) (pediatric): Secondary | ICD-10-CM | POA: Diagnosis not present

## 2018-06-04 DIAGNOSIS — E7849 Other hyperlipidemia: Secondary | ICD-10-CM | POA: Diagnosis not present

## 2018-06-04 DIAGNOSIS — J9611 Chronic respiratory failure with hypoxia: Secondary | ICD-10-CM | POA: Diagnosis not present

## 2018-06-04 DIAGNOSIS — J439 Emphysema, unspecified: Secondary | ICD-10-CM | POA: Diagnosis not present

## 2018-06-04 DIAGNOSIS — R7309 Other abnormal glucose: Secondary | ICD-10-CM | POA: Diagnosis not present

## 2018-06-04 DIAGNOSIS — I272 Pulmonary hypertension, unspecified: Secondary | ICD-10-CM | POA: Diagnosis not present

## 2018-06-18 DIAGNOSIS — R600 Localized edema: Secondary | ICD-10-CM | POA: Diagnosis not present

## 2018-06-18 DIAGNOSIS — J439 Emphysema, unspecified: Secondary | ICD-10-CM | POA: Diagnosis not present

## 2018-06-18 DIAGNOSIS — J9611 Chronic respiratory failure with hypoxia: Secondary | ICD-10-CM | POA: Diagnosis not present

## 2018-06-18 DIAGNOSIS — I272 Pulmonary hypertension, unspecified: Secondary | ICD-10-CM | POA: Diagnosis not present

## 2018-06-18 DIAGNOSIS — I10 Essential (primary) hypertension: Secondary | ICD-10-CM | POA: Diagnosis not present

## 2018-06-18 DIAGNOSIS — E7849 Other hyperlipidemia: Secondary | ICD-10-CM | POA: Diagnosis not present

## 2018-06-18 DIAGNOSIS — R7309 Other abnormal glucose: Secondary | ICD-10-CM | POA: Diagnosis not present

## 2018-07-19 DIAGNOSIS — I10 Essential (primary) hypertension: Secondary | ICD-10-CM | POA: Diagnosis not present

## 2018-07-19 DIAGNOSIS — I272 Pulmonary hypertension, unspecified: Secondary | ICD-10-CM | POA: Diagnosis not present

## 2018-07-19 DIAGNOSIS — R600 Localized edema: Secondary | ICD-10-CM | POA: Diagnosis not present

## 2018-07-19 DIAGNOSIS — J9611 Chronic respiratory failure with hypoxia: Secondary | ICD-10-CM | POA: Diagnosis not present

## 2018-07-19 DIAGNOSIS — J439 Emphysema, unspecified: Secondary | ICD-10-CM | POA: Diagnosis not present

## 2018-07-19 DIAGNOSIS — G4733 Obstructive sleep apnea (adult) (pediatric): Secondary | ICD-10-CM | POA: Diagnosis not present

## 2018-07-19 DIAGNOSIS — R7309 Other abnormal glucose: Secondary | ICD-10-CM | POA: Diagnosis not present

## 2018-07-23 DIAGNOSIS — J9611 Chronic respiratory failure with hypoxia: Secondary | ICD-10-CM | POA: Diagnosis not present

## 2018-07-23 DIAGNOSIS — I10 Essential (primary) hypertension: Secondary | ICD-10-CM | POA: Diagnosis not present

## 2018-07-23 DIAGNOSIS — J439 Emphysema, unspecified: Secondary | ICD-10-CM | POA: Diagnosis not present

## 2018-07-23 DIAGNOSIS — R600 Localized edema: Secondary | ICD-10-CM | POA: Diagnosis not present

## 2018-07-26 ENCOUNTER — Encounter (INDEPENDENT_AMBULATORY_CARE_PROVIDER_SITE_OTHER): Payer: Self-pay | Admitting: Vascular Surgery

## 2018-07-26 ENCOUNTER — Ambulatory Visit (INDEPENDENT_AMBULATORY_CARE_PROVIDER_SITE_OTHER): Payer: Medicare HMO | Admitting: Vascular Surgery

## 2018-07-26 VITALS — BP 142/73 | HR 103 | Resp 18 | Ht 61.0 in | Wt 143.4 lb

## 2018-07-26 DIAGNOSIS — I890 Lymphedema, not elsewhere classified: Secondary | ICD-10-CM

## 2018-07-26 DIAGNOSIS — Z87891 Personal history of nicotine dependence: Secondary | ICD-10-CM | POA: Diagnosis not present

## 2018-07-26 DIAGNOSIS — E782 Mixed hyperlipidemia: Secondary | ICD-10-CM

## 2018-07-26 DIAGNOSIS — I272 Pulmonary hypertension, unspecified: Secondary | ICD-10-CM | POA: Diagnosis not present

## 2018-07-26 DIAGNOSIS — I872 Venous insufficiency (chronic) (peripheral): Secondary | ICD-10-CM

## 2018-07-29 ENCOUNTER — Encounter (INDEPENDENT_AMBULATORY_CARE_PROVIDER_SITE_OTHER): Payer: Self-pay | Admitting: Vascular Surgery

## 2018-07-29 DIAGNOSIS — I890 Lymphedema, not elsewhere classified: Secondary | ICD-10-CM | POA: Insufficient documentation

## 2018-07-29 DIAGNOSIS — I872 Venous insufficiency (chronic) (peripheral): Secondary | ICD-10-CM | POA: Insufficient documentation

## 2018-07-29 NOTE — Progress Notes (Signed)
MRN : [id number]  Vanessa Rowe is a 77 y.o. (1940/10/31) female who presents with chief complaint of  Chief Complaint  Patient presents with  . New Patient (Initial Visit)  .  History of Present Illness:   Patient is seen for evaluation of leg pain and leg swelling. The patient first noticed the swelling remotely. The swelling is associated with pain and discoloration. The pain and swelling worsens with prolonged dependency and improves with elevation. The pain is unrelated to activity.  The patient notes that in the morning the legs are significantly improved but they steadily worsened throughout the course of the day. The patient also notes a steady worsening of the discoloration in the ankle and shin area.   The patient denies claudication symptoms.  The patient denies symptoms consistent with rest pain.  The patient denies and extensive history of DJD and LS spine disease.  The patient has no had any past angiography, interventions or vascular surgery.  Elevation makes the leg symptoms better, dependency makes them much worse. There is no history of ulcerations. The patient denies any recent changes in medications.  The patient has not been wearing graduated compression.  The patient denies a history of DVT or PE. There is no prior history of phlebitis. There is no history of primary lymphedema.  No history of malignancies. No history of trauma or groin or pelvic surgery. There is no history of radiation treatment to the groin or pelvis  The patient denies amaurosis fugax or recent TIA symptoms. There are no recent neurological changes noted. The patient denies recent episodes of angina or shortness of breath  Duplex ultrasound is negative for DVT; no reflux  Current Meds  Medication Sig  . albuterol (PROVENTIL HFA;VENTOLIN HFA) 108 (90 BASE) MCG/ACT inhaler Inhale 2 puffs into the lungs every 4 (four) hours as needed for wheezing or shortness of breath.  Marland Kitchen. aspirin EC 81  MG tablet Take 81 mg by mouth daily.  Marland Kitchen. doxycycline (VIBRAMYCIN) 100 MG capsule Take 100 mg by mouth 2 (two) times daily.  . fluticasone furoate-vilanterol (BREO ELLIPTA) 200-25 MCG/INH AEPB Inhale 1 puff into the lungs daily.  . furosemide (LASIX) 40 MG tablet Take 40 mg by mouth daily as needed for edema.  Marland Kitchen. losartan-hydrochlorothiazide (HYZAAR) 100-25 MG per tablet Take 1 tablet by mouth daily.  . potassium chloride (K-DUR,KLOR-CON) 10 MEQ tablet Take 10 mEq by mouth daily as needed (when taking Furosemide).  Marland Kitchen. telmisartan (MICARDIS) 80 MG tablet Take 80 mg by mouth daily.  Marland Kitchen. tiotropium (SPIRIVA) 18 MCG inhalation capsule Place 18 mcg into inhaler and inhale daily.  . [DISCONTINUED] lovastatin (MEVACOR) 20 MG tablet Take 20 mg by mouth at bedtime.    Past Medical History:  Diagnosis Date  . Acute respiratory failure (HCC)   . COPD exacerbation (HCC)   . Heart murmur    when she was 77 years old  . Hepatitis    Hepatitis A (in the 70s)  . Hyperlipidemia   . Hypertension   . Pneumonia   . PONV (postoperative nausea and vomiting)   . Pulmonary hypertension (HCC)   . Shortness of breath dyspnea   . Sleep apnea     Past Surgical History:  Procedure Laterality Date  . ABDOMINAL HYSTERECTOMY    . APPENDECTOMY    . BREAST SURGERY     bilateral implants  . EYE SURGERY Right    detached retina and lens and cataract Surgery  . TONSILLECTOMY  Social History Social History   Tobacco Use  . Smoking status: Former Smoker    Packs/day: 1.00    Types: Cigarettes    Last attempt to quit: 08/04/2012    Years since quitting: 5.9  . Smokeless tobacco: Never Used  Substance Use Topics  . Alcohol use: Yes    Alcohol/week: 7.0 standard drinks    Types: 7 Glasses of wine per week  . Drug use: No    Family History Family History  Problem Relation Age of Onset  . Coronary artery disease Father   . Coronary artery disease Mother     Allergies  Allergen Reactions  . Symbicort  [Budesonide-Formoterol Fumarate] Shortness Of Breath  . Macrodantin [Nitrofurantoin Macrocrystal] Other (See Comments)    Pt states that this medication put her in kidney failure.    . Zinc Itching  . Penicillins Rash     REVIEW OF SYSTEMS (Negative unless checked)  Constitutional: [] Weight loss  [] Fever  [] Chills Cardiac: [] Chest pain   [] Chest pressure   [] Palpitations   [] Shortness of breath when laying flat   [x] Shortness of breath with exertion. Vascular:  [] Pain in legs with walking   [] Pain in legs at rest  [] History of DVT   [] Phlebitis   [x] Swelling in legs   [x] Varicose veins   [] Non-healing ulcers Pulmonary:   [] Uses home oxygen   [] Productive cough   [] Hemoptysis   [] Wheeze  [x] COPD   [] Asthma Neurologic:  [] Dizziness   [] Seizures   [] History of stroke   [] History of TIA  [] Aphasia   [] Vissual changes   [] Weakness or numbness in arm   [] Weakness or numbness in leg Musculoskeletal:   [] Joint swelling   [] Joint pain   [] Low back pain Hematologic:  [] Easy bruising  [] Easy bleeding   [] Hypercoagulable state   [] Anemic Gastrointestinal:  [] Diarrhea   [] Vomiting  [] Gastroesophageal reflux/heartburn   [] Difficulty swallowing. Genitourinary:  [] Chronic kidney disease   [] Difficult urination  [] Frequent urination   [] Blood in urine Skin:  [] Rashes   [] Ulcers  Psychological:  [] History of anxiety   []  History of major depression.  Physical Examination  Vitals:   07/26/18 1047  BP: (!) 142/73  Pulse: (!) 103  Resp: 18  Weight: 143 lb 6.4 oz (65 kg)  Height: 5\' 1"  (1.549 m)   Body mass index is 27.1 kg/m. Gen: WD/WN, NAD Head: [place]/AT, No temporalis wasting.  Ear/Nose/Throat: Hearing grossly intact, nares w/o erythema or drainage Eyes: PER, EOMI, sclera nonicteric.  Neck: Supple, no large masses.   Pulmonary:  Good air movement, no audible wheezing bilaterally, no use of accessory muscles.  Cardiac: RRR, no JVD Vascular: scattered varicosities present bilaterally.  Mild venous  stasis changes to the legs bilaterally.  3-4+ soft pitting edema Vessel Right Left  Radial Palpable Palpable  PT Palpable Palpable  DP Palpable Palpable  Gastrointestinal: Non-distended. No guarding/no peritoneal signs.  Musculoskeletal: M/S 5/5 throughout.  No deformity or atrophy.  Neurologic: CN 2-12 intact. Symmetrical.  Speech is fluent. Motor exam as listed above. Psychiatric: Judgment intact, Mood & affect appropriate for pt's clinical situation. Dermatologic: mild venous rashes no ulcers noted.  No changes consistent with cellulitis. Lymph : No lichenification or skin changes of chronic lymphedema.  CBC Lab Results  Component Value Date   WBC 16.1 (H) 01/05/2015   HGB 11.4 (L) 01/05/2015   HCT 33.3 (L) 01/05/2015   MCV 92.9 01/05/2015   PLT 315 01/05/2015    BMET    Component Value Date/Time  NA 133 (L) 01/05/2015 0530   K 3.6 01/05/2015 0530   K 4.0 03/10/2013 1211   CL 93 (L) 01/05/2015 0530   CO2 30 01/05/2015 0530   GLUCOSE 192 (H) 01/05/2015 0530   BUN 20 01/05/2015 0530   CREATININE 0.92 01/05/2015 0530   CALCIUM 9.8 01/05/2015 0530   GFRNONAA >60 01/05/2015 0530   GFRAA >60 01/05/2015 0530   CrCl cannot be calculated (Patient's most recent lab result is older than the maximum 21 days allowed.).  COAG No results found for: INR, PROTIME  Radiology No results found.    Assessment/Plan 1. Chronic venous insufficiency Recommend:  No surgery or intervention at this point in time.    I have reviewed my previous discussion with the patient regarding swelling and why it causes symptoms.  Patient will continue wearing graduated compression stockings class 1 (20-30 mmHg) on a daily basis. The patient will  beginning wearing the stockings first thing in the morning and removing them in the evening. The patient is instructed specifically not to sleep in the stockings.    In addition, behavioral modification including several periods of elevation of the lower  extremities during the day will be continued.  This was reviewed with the patient during the initial visit.  The patient will also continue routine exercise, especially walking on a daily basis as was discussed during the initial visit.    Despite conservative treatments including graduated compression therapy class 1 and behavioral modification including exercise and elevation the patient  has not obtained adequate control of the lymphedema.  The patient still has stage 3 lymphedema and therefore, I believe that a lymph pump should be added to improve the control of the patient's lymphedema.  Additionally, a lymph pump is warranted because it will reduce the risk of cellulitis and ulceration in the future.  Patient should follow-up in six months    2. Lymphedema Recommend:  No surgery or intervention at this point in time.    I have reviewed my previous discussion with the patient regarding swelling and why it causes symptoms.  Patient will continue wearing graduated compression stockings class 1 (20-30 mmHg) on a daily basis. The patient will  beginning wearing the stockings first thing in the morning and removing them in the evening. The patient is instructed specifically not to sleep in the stockings.    In addition, behavioral modification including several periods of elevation of the lower extremities during the day will be continued.  This was reviewed with the patient during the initial visit.  The patient will also continue routine exercise, especially walking on a daily basis as was discussed during the initial visit.    Despite conservative treatments including graduated compression therapy class 1 and behavioral modification including exercise and elevation the patient  has not obtained adequate control of the lymphedema.  The patient still has stage 3 lymphedema and therefore, I believe that a lymph pump should be added to improve the control of the patient's lymphedema.   Additionally, a lymph pump is warranted because it will reduce the risk of cellulitis and ulceration in the future.  Patient should follow-up in six months    3. Mixed hyperlipidemia Continue statin as ordered and reviewed, no changes at this time   4. Pulmonary hypertension (HCC) Likely a significant cause  I recommended she retry CPAP but she seems reluctant    Levora DredgeGregory Schnier, MD  07/29/2018 1:13 PM

## 2018-08-03 DIAGNOSIS — I272 Pulmonary hypertension, unspecified: Secondary | ICD-10-CM | POA: Diagnosis not present

## 2018-08-03 DIAGNOSIS — I10 Essential (primary) hypertension: Secondary | ICD-10-CM | POA: Diagnosis not present

## 2018-08-03 DIAGNOSIS — I890 Lymphedema, not elsewhere classified: Secondary | ICD-10-CM | POA: Diagnosis not present

## 2018-08-03 DIAGNOSIS — E7849 Other hyperlipidemia: Secondary | ICD-10-CM | POA: Diagnosis not present

## 2018-08-03 DIAGNOSIS — J9611 Chronic respiratory failure with hypoxia: Secondary | ICD-10-CM | POA: Diagnosis not present

## 2018-08-03 DIAGNOSIS — R7309 Other abnormal glucose: Secondary | ICD-10-CM | POA: Diagnosis not present

## 2018-08-03 DIAGNOSIS — J439 Emphysema, unspecified: Secondary | ICD-10-CM | POA: Diagnosis not present

## 2018-08-16 ENCOUNTER — Telehealth (INDEPENDENT_AMBULATORY_CARE_PROVIDER_SITE_OTHER): Payer: Self-pay

## 2018-08-16 NOTE — Telephone Encounter (Signed)
[facility] and the patient called and left a message on the triage line and stated that they needed the diagnosis to state something in regards to a wound for her stockings to be covered. The will also need a wound measurement. I did not see this listed in office visit on 07/26/18 please advise

## 2018-08-16 NOTE — Telephone Encounter (Signed)
She doesn't have wounds.  She has lymphedema.  We don't do measurements for that in office.  She needs compression stockings, which generally don't require insurance approval.

## 2018-08-16 NOTE — Telephone Encounter (Signed)
Spoke with pt and tried to explain to her Fallon's message and pt states she has wounds that was not there at the time of her appt but has since shown up. She states they are on her legs and are weaping and has some blood. Informed pt that since this is new wounds she will need to be seen. Pt agreed to an appt. Dr. Lorretta Harp was made aware of situation verbally in office.    Please schedule her for a AM appt for wound check

## 2018-08-23 ENCOUNTER — Ambulatory Visit (INDEPENDENT_AMBULATORY_CARE_PROVIDER_SITE_OTHER): Payer: PPO | Admitting: Vascular Surgery

## 2018-08-23 ENCOUNTER — Encounter (INDEPENDENT_AMBULATORY_CARE_PROVIDER_SITE_OTHER): Payer: Self-pay | Admitting: Vascular Surgery

## 2018-08-23 ENCOUNTER — Other Ambulatory Visit: Payer: Self-pay

## 2018-08-23 VITALS — BP 160/78 | HR 97 | Resp 16 | Ht 61.0 in | Wt 146.0 lb

## 2018-08-23 DIAGNOSIS — E782 Mixed hyperlipidemia: Secondary | ICD-10-CM

## 2018-08-23 DIAGNOSIS — L97909 Non-pressure chronic ulcer of unspecified part of unspecified lower leg with unspecified severity: Secondary | ICD-10-CM

## 2018-08-23 DIAGNOSIS — I872 Venous insufficiency (chronic) (peripheral): Secondary | ICD-10-CM | POA: Diagnosis not present

## 2018-08-23 DIAGNOSIS — I83009 Varicose veins of unspecified lower extremity with ulcer of unspecified site: Secondary | ICD-10-CM

## 2018-08-23 DIAGNOSIS — I890 Lymphedema, not elsewhere classified: Secondary | ICD-10-CM | POA: Diagnosis not present

## 2018-08-29 ENCOUNTER — Encounter (INDEPENDENT_AMBULATORY_CARE_PROVIDER_SITE_OTHER): Payer: Self-pay | Admitting: Vascular Surgery

## 2018-08-29 DIAGNOSIS — I83009 Varicose veins of unspecified lower extremity with ulcer of unspecified site: Secondary | ICD-10-CM | POA: Insufficient documentation

## 2018-08-29 DIAGNOSIS — L97909 Non-pressure chronic ulcer of unspecified part of unspecified lower leg with unspecified severity: Secondary | ICD-10-CM

## 2018-08-29 NOTE — Progress Notes (Signed)
MRN : [id number]  Vanessa Rowe is a 78 y.o. (21-Jun-1941) female who presents with chief complaint of  Chief Complaint  Patient presents with  . Wound Check     bilateral LE leg weeping  .  History of Present Illness:Patient is seen for evaluation of leg pain and swelling associated with bilateral ulcerations that have not been improving. The patient first noticed the swelling remotely. The swelling is associated with pain and discoloration. The pain and swelling worsens with prolonged dependency and improves with elevation. The pain is unrelated to activity.  The patient notes that in the morning the legs are better but the leg symptoms worsened throughout the course of the day. The patient has also noted a progressive worsening of the discoloration in the ankle and shin area.   The patient notes that an ulcers developed acutely without specific trauma and since it occurred it has been very slow to heal.  There is a moderate amount of drainage associated with the open area.  The wound is also very painful.  The patient denies claudication symptoms or rest pain symptoms.  The patient denies DJD and LS spine disease.  The patient has not had any past angiography, interventions or vascular surgery.  Elevation makes the leg symptoms better, dependency makes them much worse. The patient denies any recent changes in medications.  The patient has not been wearing graduated compression.  The patient denies a history of DVT or PE. There is no prior history of phlebitis. There is no history of primary lymphedema.  No history of malignancies. No history of trauma or groin or pelvic surgery. There is no history of radiation treatment to the groin or pelvis       Current Meds  Medication Sig  . albuterol (PROVENTIL HFA;VENTOLIN HFA) 108 (90 BASE) MCG/ACT inhaler Inhale 2 puffs into the lungs every 4 (four) hours as needed for wheezing or shortness of breath.  Marland Kitchen aspirin EC 81 MG tablet  Take 81 mg by mouth daily.  . fluticasone furoate-vilanterol (BREO ELLIPTA) 200-25 MCG/INH AEPB Inhale 1 puff into the lungs daily.  . furosemide (LASIX) 40 MG tablet Take 40 mg by mouth daily as needed for edema.  . potassium chloride (K-DUR,KLOR-CON) 10 MEQ tablet Take 10 mEq by mouth daily as needed (when taking Furosemide).  Marland Kitchen telmisartan (MICARDIS) 80 MG tablet Take 80 mg by mouth daily.  Marland Kitchen tiotropium (SPIRIVA) 18 MCG inhalation capsule Place 18 mcg into inhaler and inhale daily.    Past Medical History:  Diagnosis Date  . Acute respiratory failure (HCC)   . COPD exacerbation (HCC)   . Heart murmur    when she was 78 years old  . Hepatitis    Hepatitis A (in the 70s)  . Hyperlipidemia   . Hypertension   . Pneumonia   . PONV (postoperative nausea and vomiting)   . Pulmonary hypertension (HCC)   . Shortness of breath dyspnea   . Sleep apnea     Past Surgical History:  Procedure Laterality Date  . ABDOMINAL HYSTERECTOMY    . APPENDECTOMY    . BREAST SURGERY     bilateral implants  . EYE SURGERY Right    detached retina and lens and cataract Surgery  . TONSILLECTOMY      Social History Social History   Tobacco Use  . Smoking status: Former Smoker    Packs/day: 1.00    Types: Cigarettes    Last attempt to quit: 08/04/2012  Years since quitting: 6.0  . Smokeless tobacco: Never Used  Substance Use Topics  . Alcohol use: Yes    Alcohol/week: 7.0 standard drinks    Types: 7 Glasses of wine per week  . Drug use: No    Family History Family History  Problem Relation Age of Onset  . Coronary artery disease Father   . Coronary artery disease Mother     Allergies  Allergen Reactions  . Symbicort [Budesonide-Formoterol Fumarate] Shortness Of Breath  . Macrodantin [Nitrofurantoin Macrocrystal] Other (See Comments)    Pt states that this medication put her in kidney failure.    . Zinc Itching  . Penicillins Rash     REVIEW OF SYSTEMS (Negative unless  checked)  Constitutional: [] Weight loss  [] Fever  [] Chills Cardiac: [] Chest pain   [] Chest pressure   [] Palpitations   [] Shortness of breath when laying flat   [] Shortness of breath with exertion. Vascular:  [] Pain in legs with walking   [] Pain in legs at rest  [] History of DVT   [] Phlebitis   [x] Swelling in legs   [x] Varicose veins   [x] Non-healing ulcers Pulmonary:   [] Uses home oxygen   [] Productive cough   [] Hemoptysis   [] Wheeze  [] COPD   [] Asthma Neurologic:  [] Dizziness   [] Seizures   [] History of stroke   [] History of TIA  [] Aphasia   [] Vissual changes   [] Weakness or numbness in arm   [] Weakness or numbness in leg Musculoskeletal:   [] Joint swelling   [] Joint pain   [] Low back pain Hematologic:  [] Easy bruising  [] Easy bleeding   [] Hypercoagulable state   [] Anemic Gastrointestinal:  [] Diarrhea   [] Vomiting  [] Gastroesophageal reflux/heartburn   [] Difficulty swallowing. Genitourinary:  [] Chronic kidney disease   [] Difficult urination  [] Frequent urination   [] Blood in urine Skin:  [x] Rashes   [x] Ulcers  Psychological:  [] History of anxiety   []  History of major depression.  Physical Examination  Vitals:   08/23/18 1043  BP: (!) 160/78  Pulse: 97  Resp: 16  Weight: 146 lb (66.2 kg)  Height: 5\' 1"  (1.549 m)   Body mass index is 27.59 kg/m. Gen: WD/WN, NAD Head: [place]/AT, No temporalis wasting.  Ear/Nose/Throat: Hearing grossly intact, nares w/o erythema or drainage Eyes: PER, EOMI, sclera nonicteric.  Neck: Supple, no large masses.   Pulmonary:  Good air movement, no audible wheezing bilaterally, no use of accessory muscles.  Cardiac: RRR, no JVD Vascular: 2-3+ edema bilaterally with severe venous changes bilaterally.  Venous ulcers noted in the ankle area bilaterally, noninfected; 2 cm by 2.5 cm on the right circular in shape and 2 cm by 2 cm on the left and circular in shape both are superficial Vessel Right Left  Radial Palpable Palpable  PT Palpable Palpable  DP Palpable  Palpable  Gastrointestinal: Non-distended. No guarding/no peritoneal signs.  Musculoskeletal: M/S 5/5 throughout.  No deformity or atrophy.  Neurologic: CN 2-12 intact. Symmetrical.  Speech is fluent. Motor exam as listed above. Psychiatric: Judgment intact, Mood & affect appropriate for pt's clinical situation. Dermatologic: Venous stasis dermatitis with ulcers present.  No changes consistent with cellulitis. Lymph : No lichenification or skin changes of chronic lymphedema.  CBC Lab Results  Component Value Date   WBC 16.1 (H) 01/05/2015   HGB 11.4 (L) 01/05/2015   HCT 33.3 (L) 01/05/2015   MCV 92.9 01/05/2015   PLT 315 01/05/2015    BMET    Component Value Date/Time   NA 133 (L) 01/05/2015 0530   K 3.6  01/05/2015 0530   K 4.0 03/10/2013 1211   CL 93 (L) 01/05/2015 0530   CO2 30 01/05/2015 0530   GLUCOSE 192 (H) 01/05/2015 0530   BUN 20 01/05/2015 0530   CREATININE 0.92 01/05/2015 0530   CALCIUM 9.8 01/05/2015 0530   GFRNONAA >60 01/05/2015 0530   GFRAA >60 01/05/2015 0530   CrCl cannot be calculated (Patient's most recent lab result is older than the maximum 21 days allowed.).  COAG No results found for: INR, PROTIME  Radiology No results found.   Assessment/Plan 1. Venous ulcer (HCC) No surgery or intervention at this point in time.    I have had a long discussion with the patient regarding venous insufficiency and why it  causes symptoms, specifically venous ulceration . I have discussed with the patient the chronic skin changes that accompany venous insufficiency and the long term sequela such as infection and recurring  ulceration.  Patient will be placed in [employer] Boots which will be changed weekly drainage permitting.  In addition, behavioral modification including several periods of elevation of the lower extremities during the day will be continued. Achieving a position with the ankles at heart level was stressed to the patient  The patient is instructed to  begin routine exercise, especially walking on a daily basis  Patient should undergo duplex ultrasound of the venous system to ensure that DVT or reflux is not present.  Following the review of the ultrasound the patient will follow up in one week to reassess the degree of swelling and the control that Unna therapy is offering.   The patient can be assessed for graduated compression stockings or wraps as well as a Lymph Pump once the ulcers are healed.   2. Chronic venous insufficiency No surgery or intervention at this point in time.    I have had a long discussion with the patient regarding venous insufficiency and why it  causes symptoms. I have discussed with the patient the chronic skin changes that accompany venous insufficiency and the long term sequela such as infection and ulceration.  Patient will begin wearing graduated compression stockings class 1 (20-30 mmHg) or compression wraps on a daily basis a prescription was given. The patient will put the stockings on first thing in the morning and removing them in the evening. The patient is instructed specifically not to sleep in the stockings.    In addition, behavioral modification including several periods of elevation of the lower extremities during the day will be continued. I have demonstrated that proper elevation is a position with the ankles at heart level.  The patient is instructed to begin routine exercise, especially walking on a daily basis  Following the review of the ultrasound the patient will follow up in 2-3 months to reassess the degree of swelling and the control that graduated compression stockings or compression wraps  is offering.   The patient can be assessed for a Lymph Pump at that time  3. Lymphedema I have had a long discussion with the patient regarding swelling and why it  causes symptoms.  Patient will begin wearing graduated compression stockings class 1 (20-30 mmHg) on a daily basis a prescription was given.  The patient will  beginning wearing the stockings first thing in the morning and removing them in the evening. The patient is instructed specifically not to sleep in the stockings.   In addition, behavioral modification will be initiated.  This will include frequent elevation, use of over the counter pain medications and  exercise such as walking.  I have reviewed systemic causes for chronic edema such as liver, kidney and cardiac etiologies.  The patient denies problems with these organ systems.    Consideration for a lymph pump will also be made based upon the effectiveness of conservative therapy.  This would help to improve the edema control and prevent sequela such as ulcers and infections     4. Mixed hyperlipidemia Continue statin as ordered and reviewed, no changes at this time     Levora DredgeGregory Juno Alers, MD  08/29/2018 10:47 AM

## 2018-10-25 ENCOUNTER — Ambulatory Visit (INDEPENDENT_AMBULATORY_CARE_PROVIDER_SITE_OTHER): Payer: Medicare HMO | Admitting: Vascular Surgery

## 2018-11-02 ENCOUNTER — Other Ambulatory Visit: Payer: Self-pay | Admitting: *Deleted

## 2018-11-02 NOTE — Patient Outreach (Signed)
HTA HRA follow up outreach. Unable to reach Ms. Seth today, but was able to leave a message and request a return call.  Zara Council. Burgess Estelle, MSN, [facility] Gerontological Nurse Practitioner [facility] Care Management [phone]

## 2018-12-31 ENCOUNTER — Other Ambulatory Visit: Payer: Self-pay | Admitting: *Deleted

## 2018-12-31 NOTE — Patient Outreach (Signed)
HTA HRA Screen attempted. Pt answered but declined screening stating she no longer has HTA and would not provide information on her current provider.  Vanessa Rowe. Burgess Estelle, MSN, [facility] Gerontological Nurse Practitioner [facility] [phone]

## 2019-05-02 DIAGNOSIS — E7849 Other hyperlipidemia: Secondary | ICD-10-CM | POA: Diagnosis not present

## 2019-05-02 DIAGNOSIS — I890 Lymphedema, not elsewhere classified: Secondary | ICD-10-CM | POA: Diagnosis not present

## 2019-05-02 DIAGNOSIS — J9611 Chronic respiratory failure with hypoxia: Secondary | ICD-10-CM | POA: Diagnosis not present

## 2019-05-02 DIAGNOSIS — I10 Essential (primary) hypertension: Secondary | ICD-10-CM | POA: Diagnosis not present

## 2019-05-02 DIAGNOSIS — R7309 Other abnormal glucose: Secondary | ICD-10-CM | POA: Diagnosis not present

## 2019-05-06 DIAGNOSIS — J9620 Acute and chronic respiratory failure, unspecified whether with hypoxia or hypercapnia: Secondary | ICD-10-CM | POA: Diagnosis not present

## 2019-05-06 DIAGNOSIS — R600 Localized edema: Secondary | ICD-10-CM | POA: Diagnosis not present

## 2019-05-06 DIAGNOSIS — E785 Hyperlipidemia, unspecified: Secondary | ICD-10-CM | POA: Diagnosis not present

## 2019-05-06 DIAGNOSIS — Z79899 Other long term (current) drug therapy: Secondary | ICD-10-CM | POA: Diagnosis not present

## 2019-05-06 DIAGNOSIS — J449 Chronic obstructive pulmonary disease, unspecified: Secondary | ICD-10-CM | POA: Diagnosis not present

## 2019-05-06 DIAGNOSIS — R739 Hyperglycemia, unspecified: Secondary | ICD-10-CM | POA: Diagnosis not present

## 2019-05-06 DIAGNOSIS — I272 Pulmonary hypertension, unspecified: Secondary | ICD-10-CM | POA: Diagnosis not present

## 2019-07-22 DIAGNOSIS — H2512 Age-related nuclear cataract, left eye: Secondary | ICD-10-CM | POA: Diagnosis not present

## 2019-07-22 DIAGNOSIS — H35372 Puckering of macula, left eye: Secondary | ICD-10-CM | POA: Diagnosis not present

## 2019-08-09 ENCOUNTER — Encounter: Payer: Self-pay | Admitting: Ophthalmology

## 2019-08-09 ENCOUNTER — Other Ambulatory Visit: Payer: Self-pay

## 2019-08-12 ENCOUNTER — Other Ambulatory Visit: Payer: Self-pay

## 2019-08-12 ENCOUNTER — Other Ambulatory Visit
Admission: RE | Admit: 2019-08-12 | Discharge: 2019-08-12 | Disposition: A | Discharge status: Home / Self Care | Payer: Medicare Other | Source: Ambulatory Visit | Attending: Ophthalmology | Admitting: Ophthalmology

## 2019-08-12 DIAGNOSIS — Z01812 Encounter for preprocedural laboratory examination: Secondary | ICD-10-CM | POA: Insufficient documentation

## 2019-08-12 DIAGNOSIS — Z20822 Contact with and (suspected) exposure to covid-19: Secondary | ICD-10-CM | POA: Diagnosis not present

## 2019-08-12 LAB — SARS CORONAVIRUS 2 (TAT 6-24 HRS): SARS Coronavirus 2: NEGATIVE

## 2019-08-12 NOTE — Discharge Instructions (Signed)

## 2019-08-16 ENCOUNTER — Other Ambulatory Visit: Payer: Self-pay

## 2019-08-16 ENCOUNTER — Encounter
Admission: RE | Disposition: A | Discharge status: Home / Self Care | Payer: Self-pay | Source: Home / Self Care | Attending: Ophthalmology

## 2019-08-16 ENCOUNTER — Ambulatory Visit
Admission: RE | Admit: 2019-08-16 | Discharge: 2019-08-16 | Disposition: A | Discharge status: Home / Self Care | Payer: Medicare Other | Attending: Ophthalmology | Admitting: Ophthalmology

## 2019-08-16 ENCOUNTER — Encounter: Payer: Self-pay | Admitting: Ophthalmology

## 2019-08-16 ENCOUNTER — Ambulatory Visit: Payer: Medicare Other | Admitting: Anesthesiology

## 2019-08-16 DIAGNOSIS — Z7982 Long term (current) use of aspirin: Secondary | ICD-10-CM | POA: Diagnosis not present

## 2019-08-16 DIAGNOSIS — Z79899 Other long term (current) drug therapy: Secondary | ICD-10-CM | POA: Insufficient documentation

## 2019-08-16 DIAGNOSIS — Z87891 Personal history of nicotine dependence: Secondary | ICD-10-CM | POA: Insufficient documentation

## 2019-08-16 DIAGNOSIS — Z7951 Long term (current) use of inhaled steroids: Secondary | ICD-10-CM | POA: Diagnosis not present

## 2019-08-16 DIAGNOSIS — E7800 Pure hypercholesterolemia, unspecified: Secondary | ICD-10-CM | POA: Diagnosis not present

## 2019-08-16 DIAGNOSIS — H2512 Age-related nuclear cataract, left eye: Secondary | ICD-10-CM | POA: Insufficient documentation

## 2019-08-16 DIAGNOSIS — J449 Chronic obstructive pulmonary disease, unspecified: Secondary | ICD-10-CM | POA: Insufficient documentation

## 2019-08-16 DIAGNOSIS — I10 Essential (primary) hypertension: Secondary | ICD-10-CM | POA: Diagnosis not present

## 2019-08-16 DIAGNOSIS — G4730 Sleep apnea, unspecified: Secondary | ICD-10-CM | POA: Diagnosis not present

## 2019-08-16 HISTORY — DX: Presence of dental prosthetic device (complete) (partial): Z97.2

## 2019-08-16 HISTORY — PX: CATARACT EXTRACTION W/PHACO: SHX586

## 2019-08-16 HISTORY — DX: Lymphedema, not elsewhere classified: I89.0

## 2019-08-16 SURGERY — PHACOEMULSIFICATION, CATARACT, WITH IOL INSERTION
Anesthesia: Monitor Anesthesia Care | Site: Eye | Laterality: Left

## 2019-08-16 MED ORDER — NA CHONDROIT SULF-NA HYALURON 40-17 MG/ML IO SOLN
INTRAOCULAR | Status: DC | PRN
  Administered 2019-08-16: 1 mL via INTRAOCULAR

## 2019-08-16 MED ORDER — FENTANYL CITRATE (PF) 100 MCG/2ML IJ SOLN
INTRAMUSCULAR | Status: DC | PRN
  Administered 2019-08-16: 50 ug via INTRAVENOUS

## 2019-08-16 MED ORDER — MIDAZOLAM HCL 2 MG/2ML IJ SOLN
INTRAMUSCULAR | Status: DC | PRN
  Administered 2019-08-16: 1.5 mg via INTRAVENOUS

## 2019-08-16 MED ORDER — TETRACAINE HCL 0.5 % OP SOLN
1.0000 [drp] | OPHTHALMIC | Status: DC | PRN
  Administered 2019-08-16 (×3): 1 [drp] via OPHTHALMIC

## 2019-08-16 MED ORDER — MOXIFLOXACIN HCL 0.5 % OP SOLN
OPHTHALMIC | Status: DC | PRN
  Administered 2019-08-16: 0.2 mL via OPHTHALMIC

## 2019-08-16 MED ORDER — LIDOCAINE HCL (PF) 2 % IJ SOLN
INTRAOCULAR | Status: DC | PRN
  Administered 2019-08-16: 1 mL

## 2019-08-16 MED ORDER — ARMC OPHTHALMIC DILATING DROPS
1.0000 "application " | OPHTHALMIC | Status: DC | PRN
  Administered 2019-08-16 (×3): 1 via OPHTHALMIC

## 2019-08-16 MED ORDER — EPINEPHRINE PF 1 MG/ML IJ SOLN
INTRAOCULAR | Status: DC | PRN
  Administered 2019-08-16: 65 mL via OPHTHALMIC

## 2019-08-16 SURGICAL SUPPLY — 20 items
CANNULA ANT/CHMB 27G (MISCELLANEOUS) ×2 IMPLANT
CANNULA ANT/CHMB 27GA (MISCELLANEOUS) ×6 IMPLANT
GLOVE SURG LX 8.0 MICRO (GLOVE) ×2
GLOVE SURG LX STRL 8.0 MICRO (GLOVE) ×1 IMPLANT
GLOVE SURG TRIUMPH 8.0 PF LTX (GLOVE) ×3 IMPLANT
GOWN STRL REUS W/ TWL LRG LVL3 (GOWN DISPOSABLE) ×2 IMPLANT
GOWN STRL REUS W/TWL LRG LVL3 (GOWN DISPOSABLE) ×4
LENS IOL TECNIS ITEC 16.0 (Intraocular Lens) ×2 IMPLANT
MARKER SKIN DUAL TIP RULER LAB (MISCELLANEOUS) ×3 IMPLANT
NDL FILTER BLUNT 18X1 1/2 (NEEDLE) ×1 IMPLANT
NDL RETROBULBAR .5 NSTRL (NEEDLE) ×3 IMPLANT
NEEDLE FILTER BLUNT 18X 1/2SAF (NEEDLE) ×2
NEEDLE FILTER BLUNT 18X1 1/2 (NEEDLE) ×1 IMPLANT
PACK EYE AFTER SURG (MISCELLANEOUS) ×3 IMPLANT
PACK OPTHALMIC (MISCELLANEOUS) ×3 IMPLANT
PACK PORFILIO (MISCELLANEOUS) ×3 IMPLANT
SYR 3ML LL SCALE MARK (SYRINGE) ×3 IMPLANT
SYR TB 1ML LUER SLIP (SYRINGE) ×3 IMPLANT
WATER STERILE IRR 250ML POUR (IV SOLUTION) ×3 IMPLANT
WIPE NON LINTING 3.25X3.25 (MISCELLANEOUS) ×3 IMPLANT

## 2019-08-16 NOTE — Anesthesia Preprocedure Evaluation (Signed)
Anesthesia Evaluation  Patient identified by MRN, date of birth, ID band Patient awake    History of Anesthesia Complications (+) PONV and history of anesthetic complications  Airway Mallampati: II  TM Distance: >3 FB Neck ROM: Full    Dental  (+) Partial Upper   Pulmonary COPD,  COPD inhaler, former smoker,    Pulmonary exam normal        Cardiovascular Exercise Tolerance: Good hypertension, Pt. on medications Normal cardiovascular exam     Neuro/Psych    GI/Hepatic negative GI ROS, Neg liver ROS,   Endo/Other  negative endocrine ROS  Renal/GU negative Renal ROS     Musculoskeletal negative musculoskeletal ROS (+)   Abdominal   Peds  Hematology negative hematology ROS (+)   Anesthesia Other Findings   Reproductive/Obstetrics                             Anesthesia Physical Anesthesia Plan  ASA: III  Anesthesia Plan: MAC   Post-op Pain Management:    Induction:   PONV Risk Score and Plan: 3 and Treatment may vary due to age or medical condition and Midazolam  Airway Management Planned: Nasal Cannula and Natural Airway  Additional Equipment: None  Intra-op Plan:   Post-operative Plan:   Informed Consent: I have reviewed the patients History and Physical, chart, labs and discussed the procedure including the risks, benefits and alternatives for the proposed anesthesia with the patient or authorized representative who has indicated his/her understanding and acceptance.       Plan Discussed with: CRNA  Anesthesia Plan Comments:         Anesthesia Quick Evaluation

## 2019-08-16 NOTE — Anesthesia Procedure Notes (Signed)
Procedure Name: MAC Performed by: Bryndon Cumbie, CRNA Pre-anesthesia Checklist: Patient identified, Emergency Drugs available, Suction available, Timeout performed and Patient being monitored Patient Re-evaluated:Patient Re-evaluated prior to induction Oxygen Delivery Method: Nasal cannula Placement Confirmation: positive ETCO2       

## 2019-08-16 NOTE — Transfer of Care (Signed)
Immediate Anesthesia Transfer of Care Note  Patient: Vanessa Rowe [facility]  Procedure(s) Performed: CATARACT EXTRACTION PHACO AND INTRAOCULAR LENS PLACEMENT (IOC) LEFT 7.41  00:36.8 (Left Eye)  Patient Location: PACU  Anesthesia Type: MAC  Level of Consciousness: awake, alert  and patient cooperative  Airway and Oxygen Therapy: Patient Spontanous Breathing and Patient connected to supplemental oxygen  Post-op Assessment: Post-op Vital signs reviewed, Patient's Cardiovascular Status Stable, Respiratory Function Stable, Patent Airway and No signs of Nausea or vomiting  Post-op Vital Signs: Reviewed and stable  Complications: No apparent anesthesia complications

## 2019-08-16 NOTE — Op Note (Signed)
PREOPERATIVE DIAGNOSIS:  Nuclear sclerotic cataract of the left eye.   POSTOPERATIVE DIAGNOSIS:  Nuclear sclerotic cataract of the left eye.   OPERATIVE PROCEDURE:@   SURGEON:  Galen Manila, MD.   ANESTHESIA:  Anesthesiologist: Page, Wille Celeste, MD CRNA: Jimmy Picket, CRNA  1.      Managed anesthesia care. 2.     0.57ml of Shugarcaine was instilled following the paracentesis   COMPLICATIONS:  None.   TECHNIQUE:   Stop and chop   DESCRIPTION OF PROCEDURE:  The patient was examined and consented in the preoperative holding area where the aforementioned topical anesthesia was applied to the left eye and then brought back to the Operating Room where the left eye was prepped and draped in the usual sterile ophthalmic fashion and a lid speculum was placed. A paracentesis was created with the side port blade and the anterior chamber was filled with viscoelastic. A near clear corneal incision was performed with the steel keratome. A continuous curvilinear capsulorrhexis was performed with a cystotome followed by the capsulorrhexis forceps. Hydrodissection and hydrodelineation were carried out with BSS on a blunt cannula. The lens was removed in a stop and chop  technique and the remaining cortical material was removed with the irrigation-aspiration handpiece. The capsular bag was inflated with viscoelastic and the Technis [id number] lens was placed in the capsular bag without complication. The remaining viscoelastic was removed from the eye with the irrigation-aspiration handpiece. The wounds were hydrated. The anterior chamber was flushed with BSS and the eye was inflated to physiologic pressure. 0.65ml Vigamox was placed in the anterior chamber. The wounds were found to be water tight. The eye was dressed with Vigamox. The patient was given protective glasses to wear throughout the day and a shield with which to sleep tonight. The patient was also given drops with which to begin a drop regimen today and  will follow-up with me in one day. Implant Name Type Inv. Item Serial No. Manufacturer Lot No. LRB No. Used Action  LENS IOL DIOP 16.0 - [id number] Intraocular Lens LENS IOL DIOP 16.0 [id number] AMO  Left 1 Implanted    Procedure(s): CATARACT EXTRACTION PHACO AND INTRAOCULAR LENS PLACEMENT (IOC) LEFT 7.41  00:36.8 (Left)  Electronically signed: Galen Manila 08/16/2019 10:48 AM

## 2019-08-16 NOTE — H&P (Signed)
All labs reviewed. Abnormal studies sent to patients PCP when indicated.  Previous H&P reviewed, patient examined, there are NO CHANGES.  Vanessa Rowe Porfilio1/12/202110:20 AM

## 2019-08-16 NOTE — Anesthesia Postprocedure Evaluation (Signed)
Anesthesia Post Note  Patient: Vanessa Rowe [facility]  Procedure(s) Performed: CATARACT EXTRACTION PHACO AND INTRAOCULAR LENS PLACEMENT (IOC) LEFT 7.41  00:36.8 (Left Eye)     Patient location during evaluation: PACU Anesthesia Type: MAC Level of consciousness: awake and alert Pain management: pain level controlled Vital Signs Assessment: post-procedure vital signs reviewed and stable Respiratory status: spontaneous breathing Cardiovascular status: blood pressure returned to baseline Postop Assessment: no apparent nausea or vomiting, adequate PO intake and no headache Anesthetic complications: no    Adele Barthel Dayani Winbush

## 2019-08-17 ENCOUNTER — Encounter: Payer: Self-pay | Admitting: *Deleted

## 2020-03-02 IMAGING — US US EXTREM LOW VENOUS*L*
1 series · 13 of 24 positions shown · non-contrast
Comparison: None.

CLINICAL DATA: Left lower extremity pain and edema. Evaluate for
DVT.



[Series 1: us extrem low venous*left* · 0.07mm/px · 13 of 33 slices shown]
[im 1/33]
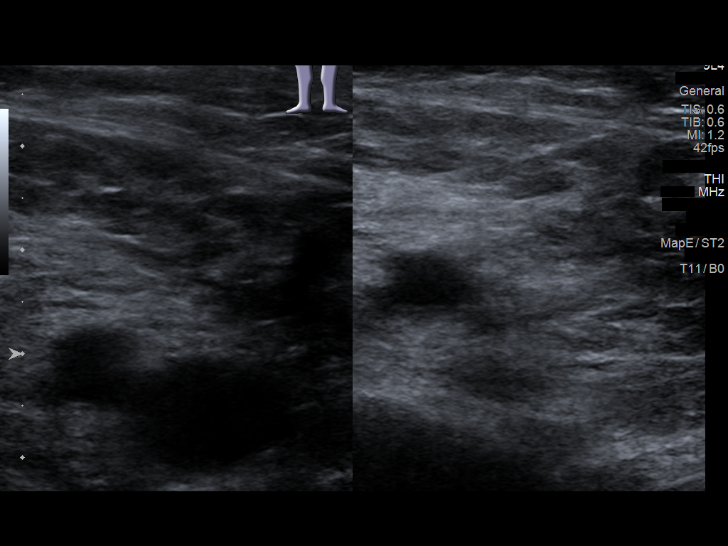
[im 3/33]
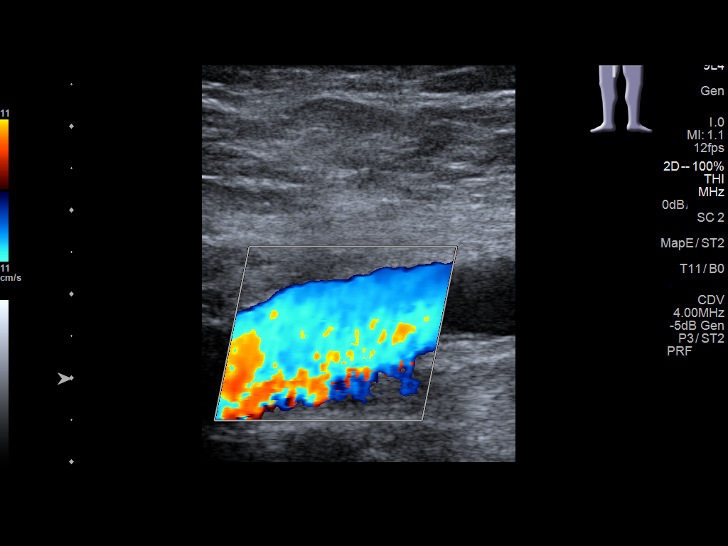
[im 6/33]
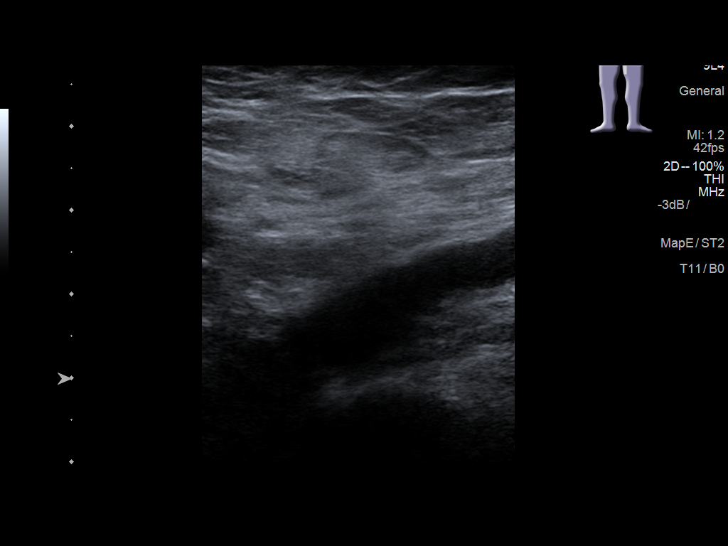
[im 9/33]
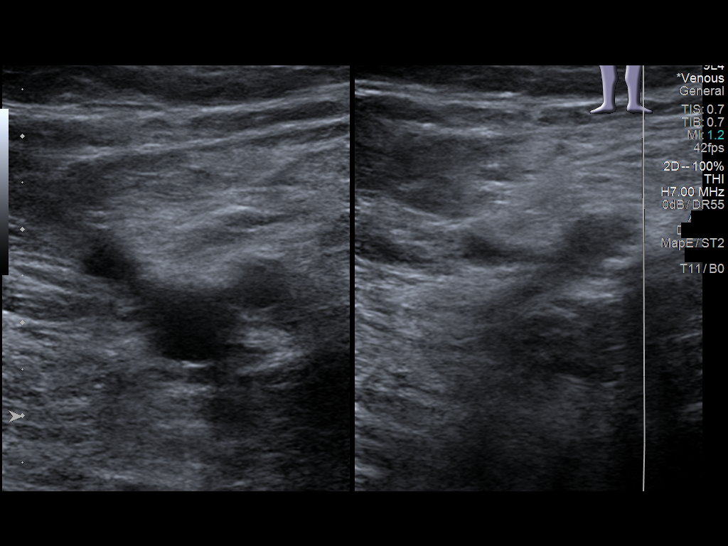
[im 12/33]
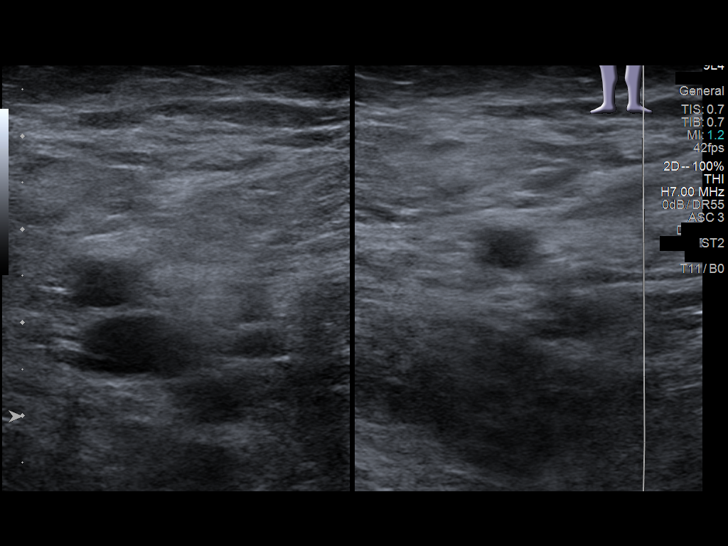
[im 14/33]
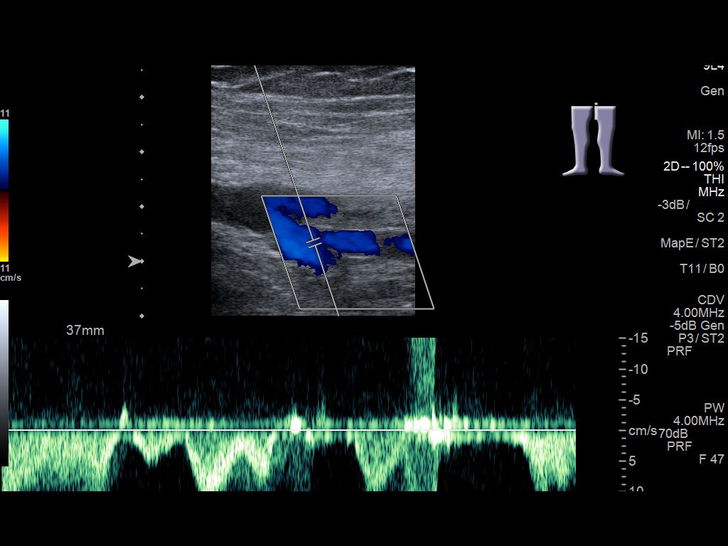
[im 17/33]
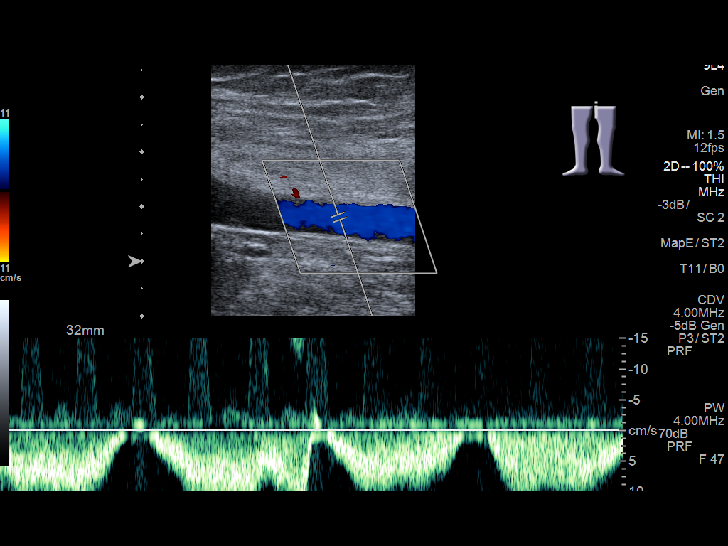
[im 19/33]
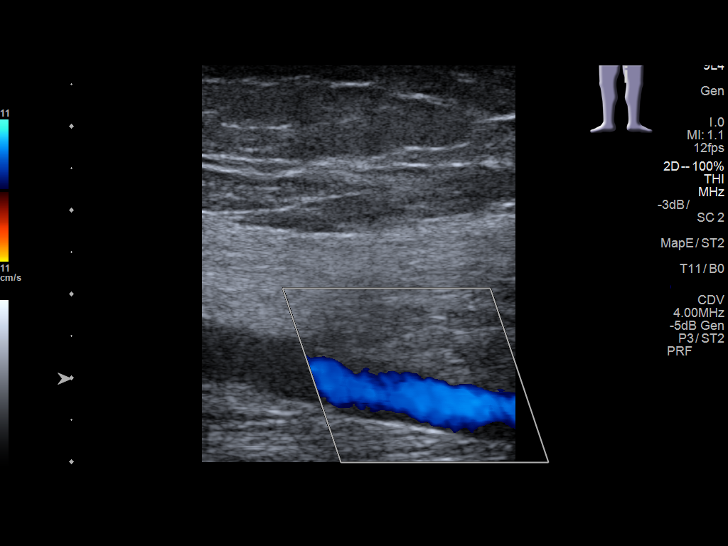
[im 21/33]
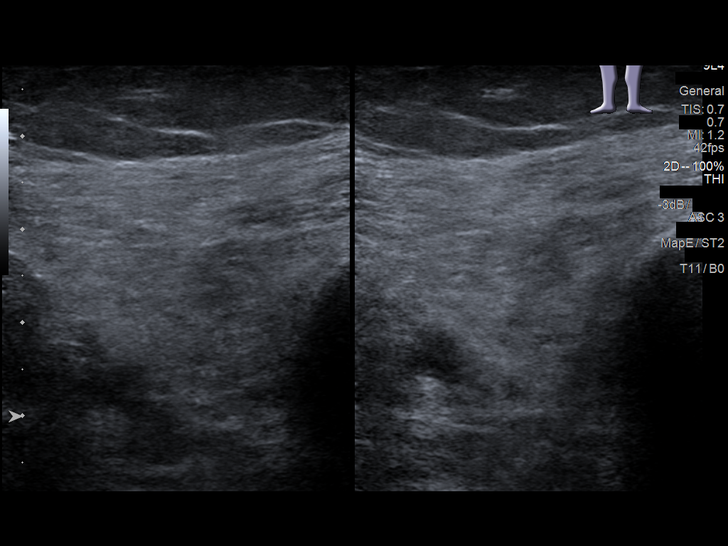
[im 24/33]
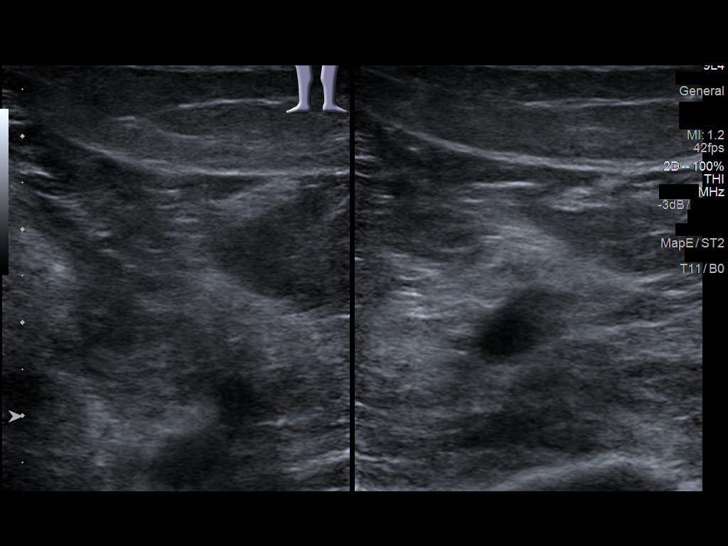
[im 27/33]
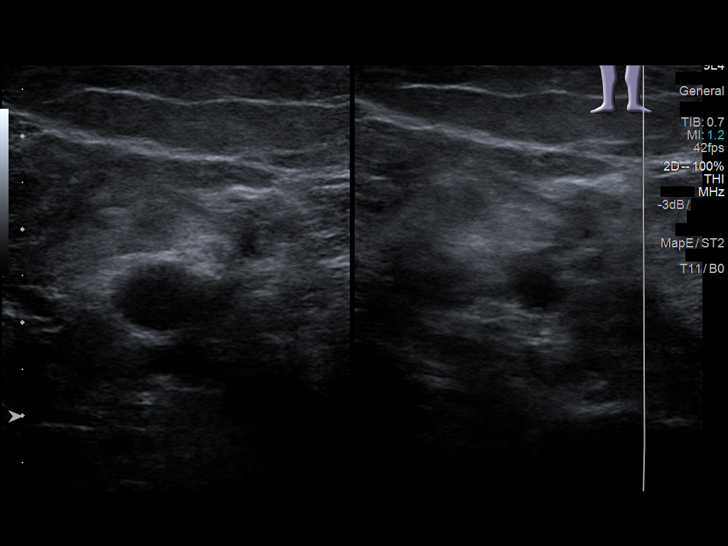
[im 30/33]
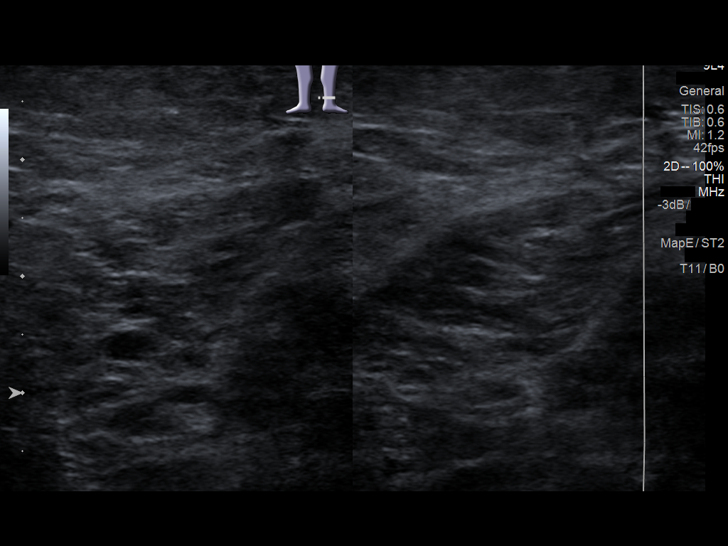
[im 33/33]
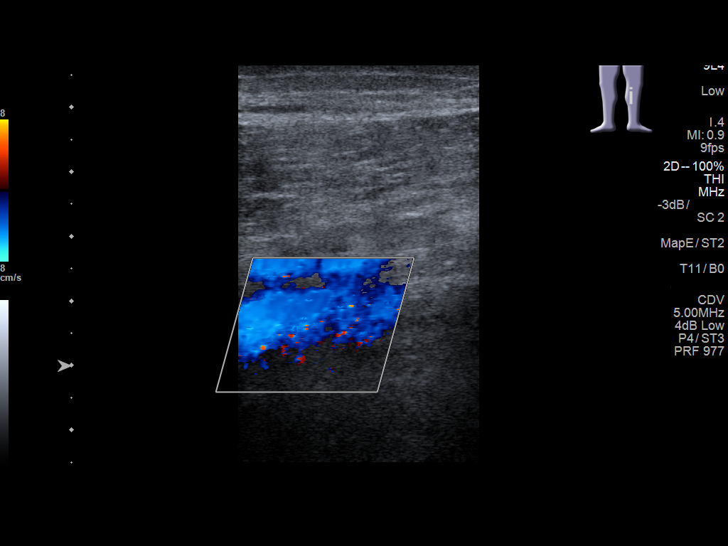

[13 of 24 positions shown; findings below may reference images not displayed]

FINDINGS: Contralateral Common Femoral Vein: Respiratory phasicity is normal
and symmetric with the symptomatic side. No evidence of thrombus.
Normal compressibility.

Common Femoral Vein: No evidence of thrombus. Normal
compressibility, respiratory phasicity and response to augmentation.

Saphenofemoral Junction: No evidence of thrombus. Normal
compressibility and flow on color Doppler imaging.

Profunda Femoral Vein: No evidence of thrombus. Normal
compressibility and flow on color Doppler imaging.

Femoral Vein: No evidence of thrombus. Normal compressibility,
respiratory phasicity and response to augmentation.

Popliteal Vein: No evidence of thrombus. Normal compressibility,
respiratory phasicity and response to augmentation.

Calf Veins: No evidence of thrombus. Normal compressibility and flow
on color Doppler imaging.

Superficial Great Saphenous Vein: No evidence of thrombus. Normal
compressibility.

Venous Reflux:  None.

Other Findings:  None.
IMPRESSION: No evidence of DVT within the left lower extremity.

## 2020-06-27 ENCOUNTER — Other Ambulatory Visit: Payer: Self-pay | Admitting: Internal Medicine

## 2020-06-27 DIAGNOSIS — R600 Localized edema: Secondary | ICD-10-CM

## 2020-07-03 ENCOUNTER — Ambulatory Visit: Admission: RE | Admit: 2020-07-03 | Payer: Medicare Other | Source: Ambulatory Visit

## 2020-07-09 ENCOUNTER — Ambulatory Visit
Admission: RE | Admit: 2020-07-09 | Discharge: 2020-07-09 | Disposition: A | Discharge status: Home / Self Care | Payer: Medicare Other | Source: Ambulatory Visit | Attending: Internal Medicine | Admitting: Internal Medicine

## 2020-07-09 ENCOUNTER — Other Ambulatory Visit: Payer: Self-pay

## 2020-07-09 DIAGNOSIS — R600 Localized edema: Secondary | ICD-10-CM | POA: Insufficient documentation

## 2020-08-15 ENCOUNTER — Encounter (INDEPENDENT_AMBULATORY_CARE_PROVIDER_SITE_OTHER): Payer: PPO | Admitting: Nurse Practitioner

## 2020-08-28 ENCOUNTER — Other Ambulatory Visit: Payer: Self-pay

## 2020-08-28 ENCOUNTER — Telehealth (INDEPENDENT_AMBULATORY_CARE_PROVIDER_SITE_OTHER): Payer: Self-pay

## 2020-08-28 ENCOUNTER — Ambulatory Visit (INDEPENDENT_AMBULATORY_CARE_PROVIDER_SITE_OTHER): Payer: Medicare Other | Admitting: Vascular Surgery

## 2020-08-28 VITALS — BP 143/84 | HR 158 | Ht 61.0 in | Wt 107.0 lb

## 2020-08-28 DIAGNOSIS — G4733 Obstructive sleep apnea (adult) (pediatric): Secondary | ICD-10-CM | POA: Insufficient documentation

## 2020-08-28 DIAGNOSIS — I83009 Varicose veins of unspecified lower extremity with ulcer of unspecified site: Secondary | ICD-10-CM

## 2020-08-28 DIAGNOSIS — L97909 Non-pressure chronic ulcer of unspecified part of unspecified lower leg with unspecified severity: Secondary | ICD-10-CM

## 2020-08-28 DIAGNOSIS — I10 Essential (primary) hypertension: Secondary | ICD-10-CM | POA: Insufficient documentation

## 2020-08-28 DIAGNOSIS — I272 Pulmonary hypertension, unspecified: Secondary | ICD-10-CM | POA: Diagnosis not present

## 2020-08-28 DIAGNOSIS — I890 Lymphedema, not elsewhere classified: Secondary | ICD-10-CM | POA: Diagnosis not present

## 2020-08-28 NOTE — Assessment & Plan Note (Signed)
The patient has severe weeping and skin breakdown to both lower extremities associated with severe swelling. She did not have a DVT on her duplex, but she has not had a true reflux study. Once her swelling is under better control with several weeks in Unna boots we will try to get that obtained. A 3 layer Unna boot was placed today with calamine as she is intolerant to zinc. This will be changed weekly and we will try to get home health to do calamine wraps on her. We'll see her back in about a month with a reflux study.

## 2020-08-28 NOTE — Telephone Encounter (Signed)
I spoke with Judeth Cornfield with [facility] about new orders for unna wraps and she ask if the patient had an ABI. I made Judeth Cornfield aware that the patient was referred as a new patient.Orders to start bilateral calamine unna wraps and office note has been fax to [facility]. Judeth Cornfield will contact the office if anything is needed for the referral.

## 2020-08-28 NOTE — Patient Instructions (Signed)
Lymphedema  Lymphedema is swelling that is caused by the abnormal collection of lymph in the tissues under the skin. Lymph is excess fluid from the tissues in your body that is removed through the lymphatic system. This system is part of your body's defense system (immune system) and includes lymph nodes and lymph vessels. The lymph vessels collect and carry the excess fluid, fats, proteins, and waste from the tissues of the body to the bloodstream. This system also works to clean and remove bacteria and waste products from the body. Lymphedema occurs when the lymphatic system is blocked. When the lymph vessels or lymph nodes are blocked or damaged, lymph does not drain properly. This causes an abnormal buildup of lymph, which leads to swelling in the affected area. This may include the trunk area, or an arm or leg. Lymphedema cannot be cured by medicines, but various methods can be used to help reduce the swelling. What are the causes? The cause of this condition depends on the type of lymphedema that you have.  Primary lymphedema is caused by the absence of lymph vessels or having abnormal lymph vessels at birth.  Secondary lymphedema occurs when lymph vessels are blocked or damaged. Secondary lymphedema is more common. Common causes of lymph vessel blockage include: ? Skin infection, such as cellulitis. ? Infection by parasites (filariasis). ? Injury. ? Radiation therapy. ? Cancer. ? Formation of scar tissue. ? Surgery. What are the signs or symptoms? Symptoms of this condition include:  Swelling of the arm or leg.  A heavy or tight feeling in the arm or leg.  Swelling of the feet, toes, or fingers. Shoes or rings may fit more tightly than before.  Redness of the skin over the affected area.  Limited movement of the affected limb.  Sensitivity to touch or discomfort in the affected limb. How is this diagnosed? This condition may be diagnosed based on:  Your symptoms and medical  history.  A physical exam.  Bioimpedance spectroscopy. In this test, painless electrical currents are used to measure fluid levels in your body.  Imaging tests, such as: ? MRI. ? CT scan. ? Duplex ultrasound. This test uses sound waves to produce images of the vessels and the blood flow on a screen. ? Lymphoscintigraphy. In this test, a low dose of a radioactive substance is injected to trace the flow of lymph through your lymph vessels. ? Lymphangiography. In this test, a contrast dye is injected into the lymph vessel to help show blockages. How is this treated? If an underlying condition is causing the lymphedema, that condition will be treated. For example, antibiotic medicines may be used to treat an infection. Treatment for this condition will depend on the cause of your lymphedema. Treatment may include:  Complete decongestive therapy (CDT). This is done by a certified lymphedema therapist to reduce fluid congestion. This therapy includes: ? Skin care. ? Compression wrapping of the affected area. ? Manual lymph drainage. This is a special massage technique that promotes lymph drainage out of a limb. ? Specific exercises. Certain exercises can help fluid move out of the affected limb.  Compression. Various methods may be used to apply pressure to the affected limb to reduce the swelling. They include: ? Wearing compression stockings or sleeves on the affected limb. ? Wrapping the affected limb with special bandages.  Surgery. This is usually done for severe cases only. For example, surgery may be done if you have trouble moving the limb or if the swelling does not   get better with other treatments.   Follow these instructions at home: Self-care  The affected area is more likely to become injured or infected. Take these steps to help prevent infection: ? Keep the affected area clean and dry. ? Use approved creams or lotions to keep the skin moisturized. ? Protect your skin from  cuts:  Use gloves while cooking or gardening.  Do not walk barefoot.  If you shave the affected area, use an electric razor.  Do not wear tight clothes, shoes, or jewelry.  Eat a healthy diet that includes a lot of fruits and vegetables. Activity  Do exercises as told by your health care provider.  Do not sit with your legs crossed.  When possible, keep the affected limb raised (elevated) above the level of your heart.  Avoid carrying things with an arm that is affected by lymphedema. General instructions  Wear compression stockings or sleeves as told by your health care provider.  Note any changes in size of the affected limb. You may be instructed to take regular measurements and keep track of them.  Take over-the-counter and prescription medicines only as told by your health care provider.  If you were prescribed an antibiotic medicine, take or apply it as told by your health care provider. Do not stop using the antibiotic even if you start to feel better or if your condition improves.  Do not use heating pads or ice packs on the affected area.  Avoid having blood draws, IV insertions, or blood pressure checks on the affected limb.  Keep all follow-up visits. This is important. Contact a health care provider if you:  Continue to have swelling in your limb.  Have fluid leaking from the skin of your swollen limb.  Have a cut that does not heal.  Have redness or pain in the affected area.  Develop purplish spots, rash, blisters, or sores (lesions) on your affected limb. Get help right away if you:  Have new swelling in your limb that starts suddenly.  Have shortness of breath or chest pain.  Have a fever or chills. These symptoms may represent a serious problem that is an emergency. Do not wait to see if the symptoms will go away. Get medical help right away. Call your local emergency services (911 in the [place] Do not drive yourself to the  hospital. Summary  Lymphedema is swelling that is caused by the abnormal collection of lymph in the tissues under the skin.  Lymph is fluid from the tissues in your body that is removed through the lymphatic system. This system collects and carries excess fluid, fats, proteins, and wastes from the tissues of the body to the bloodstream.  Lymphedema causes swelling, pain, and redness in the affected area. This may include the trunk area, or an arm or leg.  Treatment for this condition may depend on the cause of your lymphedema. Treatment may include treating the underlying cause, complete decongestive therapy (CDT), compression methods, or surgery. This information is not intended to replace advice given to you by your health care provider. Make sure you discuss any questions you have with your health care provider. Document Revised: 05/16/2020 Document Reviewed: 05/16/2020 Elsevier Patient Education  2021 [employer]  

## 2020-08-28 NOTE — Assessment & Plan Note (Signed)
Severe heart and lung disease are likely playing a major role with her chronic leg swelling.

## 2020-08-28 NOTE — Assessment & Plan Note (Signed)
The patient is quite severe lymphedema and has for some time. She has a lymphedema pump at home, but does not know how to use this. We will try to contact [facility] and see if they can come out and service the machine and get her up to speed with it because this would certainly be helpful.

## 2020-08-28 NOTE — Assessment & Plan Note (Signed)
blood pressure control important in reducing the progression of atherosclerotic disease. On appropriate oral medications.  

## 2020-08-28 NOTE — Progress Notes (Signed)
Patient ID: Vanessa Rowe, female   DOB: Oct 03, 1940, 80 y.o.   MRN: [id number]  Chief Complaint  Patient presents with  . New Patient (Initial Visit)    Vanessa Rowe. Lymphedema    HPI Vanessa Rowe is a 80 y.o. female.  I am asked to see the patient by Dr. Graciela Rowe for evaluation of lymphedema and marked leg swelling and weeping in both lower extremities that has now been present for 2 to 3 months. She has had chronic swelling for many years. She was actually seen by my partner over 2 years ago for swelling but did not follow-up for return visits likely secondary to COVID-19 and other restrictions. She was doing reasonably well until 2 to 3 months ago when the swelling became quite severe and she started having weeping and skin breakdown. Both legs are affected. No fevers or chills. She has multiple medical comorbidities as listed below. She is intolerant to zinc and so has not done standard in Unna boots. She is just been getting wraps at home and her wraps week through within 24 to 48 hours. These legs are very painful and she is having difficulty with walking. Her legs are dependent much of the time.     Past Medical History:  Diagnosis Date  . Acute respiratory failure (HCC)   . COPD exacerbation (HCC)   . Heart murmur    when she was 80 years old  . Hepatitis    Hepatitis A (in the 70s)  . Hyperlipidemia   . Hypertension   . Lymphedema    lower legs and feet  . Pneumonia   . PONV (postoperative nausea and vomiting)   . Pulmonary hypertension (HCC)   . Shortness of breath dyspnea   . Sleep apnea   . Wears dentures    full upper    Past Surgical History:  Procedure Laterality Date  . ABDOMINAL HYSTERECTOMY    . APPENDECTOMY    . BREAST SURGERY     bilateral implants  . CATARACT EXTRACTION W/PHACO Left 08/16/2019   Procedure: CATARACT EXTRACTION PHACO AND INTRAOCULAR LENS PLACEMENT (IOC) LEFT 7.41  00:36.8;  Surgeon: Galen Manila, MD;  Location: [facility];   Service: Ophthalmology;  Laterality: Left;  . EYE SURGERY Right    detached retina and lens and cataract Surgery  . TONSILLECTOMY       Family History  Problem Relation Age of Onset  . Coronary artery disease Father   . Coronary artery disease Mother   No bleeding or clotting disorders   Social History   Tobacco Use  . Smoking status: Former Smoker    Packs/day: 1.00    Types: Cigarettes    Quit date: 08/04/2012    Years since quitting: 8.0  . Smokeless tobacco: Never Used  Vaping Use  . Vaping Use: Never used  Substance Use Topics  . Alcohol use: Yes    Alcohol/week: 7.0 standard drinks    Types: 7 Glasses of wine per week  . Drug use: No    Allergies  Allergen Reactions  . Symbicort [Budesonide-Formoterol Fumarate] Shortness Of Breath  . Macrodantin [Nitrofurantoin Macrocrystal] Other (See Comments)    Pt states that this medication put her in kidney failure.    Virgel Gess Other (See Comments)  . Zinc Itching  . Penicillins Rash    Current Outpatient Medications  Medication Sig Dispense Refill  . albuterol (PROVENTIL HFA;VENTOLIN HFA) 108 (90 BASE) MCG/ACT inhaler Inhale 2 puffs into the lungs  every 4 (four) hours as needed for wheezing or shortness of breath.    Marland Kitchen aspirin EC 81 MG tablet Take 81 mg by mouth daily.    . cholecalciferol (VITAMIN D) 25 MCG (1000 UT) tablet Take 1,000 Units by mouth daily.    Marland Kitchen doxycycline (VIBRAMYCIN) 100 MG capsule Take by mouth.    . Fluticasone-Salmeterol (ADVAIR) 250-50 MCG/DOSE AEPB Inhale 1 puff into the lungs 2 (two) times daily.    . furosemide (LASIX) 40 MG tablet Take 40 mg by mouth daily as needed for edema.    . lovastatin (MEVACOR) 20 MG tablet Take 20 mg by mouth daily.    . potassium chloride (K-DUR,KLOR-CON) 10 MEQ tablet Take 10 mEq by mouth daily as needed (when taking Furosemide).    . potassium chloride (KLOR-CON) 10 MEQ tablet Take by mouth.    . telmisartan (MICARDIS) 80 MG tablet Take 80  mg by mouth daily.    Marland Kitchen tiotropium (SPIRIVA) 18 MCG inhalation capsule Place 18 mcg into inhaler and inhale daily.     No current facility-administered medications for this visit.     REVIEW OF SYSTEMS (Negative unless checked)  Constitutional: [] ?Weight loss  [] ?Fever  [] ?Chills Cardiac: [] ?Chest pain   [] ?Chest pressure   [x] ?Palpitations   [] ?Shortness of breath when laying flat   [x] ?Shortness of breath with exertion. Vascular:  [] ?Pain in legs with walking   [] ?Pain in legs at rest  [] ?History of DVT   [] ?Phlebitis   [x] ?Swelling in legs   [x] ?Varicose veins   [x] ?Non-healing ulcers Pulmonary:   [] ?Uses home oxygen   [] ?Productive cough   [] ?Hemoptysis   [] ?Wheeze  [] ?COPD   [] ?Asthma Neurologic:  [] ?Dizziness   [] ?Seizures   [] ?History of stroke   [] ?History of TIA  [] ?Aphasia   [] ?Vissual changes   [] ?Weakness or numbness in arm   [] ?Weakness or numbness in leg Musculoskeletal:   [] ?Joint swelling   [] ?Joint pain   [] ?Low back pain Hematologic:  [] ?Easy bruising  [] ?Easy bleeding   [] ?Hypercoagulable state   [] ?Anemic Gastrointestinal:  [] ?Diarrhea   [] ?Vomiting  [] ?Gastroesophageal reflux/heartburn   [] ?Difficulty swallowing. Genitourinary:  [] ?Chronic kidney disease   [] ?Difficult urination  [] ?Frequent urination   [] ?Blood in urine Skin:  [x] ?Rashes   [x] ?Ulcers  Psychological:  [] ?History of anxiety   [] ? History of major depression.    Physical Exam BP (!) 143/84   Pulse (!) 158   Ht 5\' 1"  (1.549 m)   Wt 107 lb (48.5 kg)   BMI 20.22 kg/m  Gen:  WD/WN, NAD Head: [place]/AT, No temporalis wasting.  Ear/Nose/Throat: Hearing grossly intact, nares w/o erythema or drainage, oropharynx w/o Erythema/Exudate Eyes: Conjunctiva clear, sclera non-icteric  Neck: trachea midline.  No JVD.  Pulmonary:  Good air movement, respirations not labored, no use of accessory muscles  Cardiac: tachycardic Vascular:  Vessel Right Left  Radial Palpable Palpable                          DP  NP NP  PT NP NP   Gastrointestinal:. No masses, surgical incisions, or scars. Musculoskeletal: M/S 5/5 throughout.  Extremities without ischemic changes.  No deformity or atrophy. Superficial open wounds throughout both calves and lower legs. Moderate to severe stasis dermatitis changes present bilaterally with mild erythema. 3-4+ bilateral lower extremity edema. Neurologic: Sensation grossly intact in extremities.  Symmetrical.  Speech is fluent. Motor exam as listed above. Psychiatric: Judgment intact, Mood & affect appropriate for pt's  clinical situation. Dermatologic: Bilateral superficial ulcerations throughout both lower extremities    Radiology No results found.  Labs No results found for this or any previous visit (from the past 2160 hour(s)).  Assessment/Plan:  Lymphedema The patient is quite severe lymphedema and has for some time. She has a lymphedema pump at home, but does not know how to use this. We will try to contact [facility] and see if they can come out and service the machine and get her up to speed with it because this would certainly be helpful.  Venous ulcer (HCC) The patient has severe weeping and skin breakdown to both lower extremities associated with severe swelling. She did not have a DVT on her duplex, but she has not had a true reflux study. Once her swelling is under better control with several weeks in Unna boots we will try to get that obtained. A 3 layer Unna boot was placed today with calamine as she is intolerant to zinc. This will be changed weekly and we will try to get home health to do calamine wraps on her. We'll see her back in about a month with a reflux study.  Pulmonary hypertension Severe heart and lung disease are likely playing a major role with her chronic leg swelling.  Hypertension blood pressure control important in reducing the progression of atherosclerotic disease. On appropriate oral medications.       Festus Barren 08/28/2020,  2:07 PM   This note was created with Dragon medical transcription system.  Any errors from dictation are unintentional.

## 2020-09-25 ENCOUNTER — Ambulatory Visit (INDEPENDENT_AMBULATORY_CARE_PROVIDER_SITE_OTHER): Payer: Medicare Other | Admitting: Vascular Surgery

## 2020-09-25 ENCOUNTER — Encounter (INDEPENDENT_AMBULATORY_CARE_PROVIDER_SITE_OTHER): Payer: Medicare Other

## 2020-10-15 ENCOUNTER — Telehealth (INDEPENDENT_AMBULATORY_CARE_PROVIDER_SITE_OTHER): Payer: Self-pay

## 2020-10-15 NOTE — Telephone Encounter (Signed)
Let's try the modified wraps with xeroform, kerlix and coband

## 2020-10-15 NOTE — Telephone Encounter (Signed)
That's fine if she doesn't want to come in but if this persists we will have to see her in office

## 2020-10-15 NOTE — Telephone Encounter (Signed)
Called patient to make an appt. Patient does not want to come in to be seen or get legs wrapped.

## 2020-10-15 NOTE — Telephone Encounter (Signed)
The pt  Called and left a VM on the nurses line saying she  Is being wrapped in calamine wraps an is now itching very bad per her not from her visit with Dr. Wyn Quaker on 08/28/20 she is intolerant to zinc unna boots her last appt she canceled on 09/25/20 she  Has just been getting wraps at home an they weep through with in 24-48 hours  As of the last note.  The pt may may need to come in an be wrapped in a modified wrap do to her allergy's. Please advise.

## 2020-10-15 NOTE — Telephone Encounter (Signed)
Nurse to inform home health to make them aware of new changes with wraps.

## 2020-10-16 NOTE — Telephone Encounter (Signed)
I called [facility] and spoke with stephanie and made the changes to the pt's orders  so she will now be getting the modified wraps they will be starting on Saturday. I will call and make the pt aware.

## 2020-11-01 ENCOUNTER — Telehealth: Payer: Self-pay

## 2020-11-01 NOTE — Telephone Encounter (Signed)
Attempted to contact patient to schedule a Palliative Care consult appointment. No answer left a message to return call.  

## 2020-11-02 ENCOUNTER — Telehealth: Payer: Self-pay | Admitting: Adult Health Nurse Practitioner

## 2020-11-02 NOTE — Telephone Encounter (Signed)
Called patient's son, Jalyiah Shelley, to offer to schedule a Palliative Consult, no answer and unable to leave a message due to mailbox was full.

## 2020-11-05 ENCOUNTER — Telehealth: Payer: Self-pay | Admitting: Adult Health Nurse Practitioner

## 2020-11-05 ENCOUNTER — Telehealth: Payer: Self-pay

## 2020-11-05 NOTE — Telephone Encounter (Signed)
Returned call to patient and spoke with her regarding the Palliative referral/services and all questions were answered and she was in agreement with scheduling visit.  I have scheduled an In-home Consult for 11/20/20 @ 9 AM.

## 2020-11-05 NOTE — Telephone Encounter (Signed)
Attempted to contact patient to schedule a Palliative Care consult appointment. No answer left a message to return call.  

## 2020-11-20 ENCOUNTER — Other Ambulatory Visit: Payer: Medicare Other | Admitting: Adult Health Nurse Practitioner

## 2020-11-20 ENCOUNTER — Encounter: Payer: Self-pay | Admitting: Adult Health Nurse Practitioner

## 2020-11-20 ENCOUNTER — Other Ambulatory Visit: Payer: Self-pay

## 2020-11-20 VITALS — BP 118/78 | HR 61 | Ht 61.0 in | Wt 107.0 lb

## 2020-11-20 DIAGNOSIS — J9611 Chronic respiratory failure with hypoxia: Secondary | ICD-10-CM

## 2020-11-20 DIAGNOSIS — I272 Pulmonary hypertension, unspecified: Secondary | ICD-10-CM

## 2020-11-20 DIAGNOSIS — I872 Venous insufficiency (chronic) (peripheral): Secondary | ICD-10-CM

## 2020-11-20 DIAGNOSIS — I83009 Varicose veins of unspecified lower extremity with ulcer of unspecified site: Secondary | ICD-10-CM

## 2020-11-20 DIAGNOSIS — R0902 Hypoxemia: Secondary | ICD-10-CM

## 2020-11-20 DIAGNOSIS — Z515 Encounter for palliative care: Secondary | ICD-10-CM

## 2020-11-20 NOTE — Progress Notes (Signed)
[facility] Consult Note Telephone: [phone]  Fax: [phone]    Date of encounter: 11/20/20 PATIENT NAME: Vanessa Rowe [place] [id number]   [phone] (home)  DOB: 05-Jul-1941 MRN: [id number] PRIMARY CARE PROVIDER:    Adin Hector, MD,  [address] [place] [address] [phone]  REFERRING PROVIDER:   Adin Hector, MD [facility] [place],  [place] [address] [phone]  RESPONSIBLE PARTY:    Contact Information    Name Relation Home Work [place] Daughter [phone]     Sharalyn, [place], [place] Daughter   [phone]       I met face to face with patient in home. Palliative Care was asked to follow this patient by consultation request of  Adin Hector, MD to address advance care planning and complex medical decision making. This is the initial visit.                                     ASSESSMENT AND PLAN / RECOMMENDATIONS:   Advance Care Planning/Goals of Care: Goals include to maximize quality of life and symptom management. Patient has DNR but unsure where it is.  Filled out new one today and uploaded to [facility].  Has living will and encouraged to find it and make sure her providers have it.  CODE STATUS: DNR  Symptom Management/Plan:  Hypoxia secondary to COPD/pulmonary OEV:[id number] having increased SOB with walking, eating, talking.  Measured O2 sats on RA as no supplemental O2 in the home.  Her O2 sats did fluctuate a lot. Observed O2 sats on room air to be staying mainly at 87-88% at rest.  After walking less than 10 steps her O2 sat dropped to 73% on RA and then went up to high 80s to low 90s and at rest again stayed around high 80s. Believe patient would benefit from home oxygen.  She may need transition from inhalers to neb treatments to ensure getting proper amounts of her  medications.  Will reach out to PCP with these recommendations.  Venous insufficiency/venous ulcers: Continue follow up and  recommendations by vein and vascular.  Continue wound care by home health RN.  Support:  Patient inquiring about getting help in the home.  Does not have Medicaid for PCS services and cannot afford out of pocket.  She states that she has to pay over $100 each month for her medications.  Will put in SW referral for help with resources.  Follow up Palliative Care Visit: Palliative care will continue to follow for complex medical decision making, advance care planning, and clarification of goals. Return 6 weeks or prn. Encouraged to call with any questions or concerns.  I spent 75 minutes providing this consultation. More than 50% of the time in this consultation was spent in counseling and care coordination.  PPS: 60%  HOSPICE ELIGIBILITY/DIAGNOSIS: TBD  Chief Complaint: initial palliative visit for complex medical decision making  HISTORY OF PRESENT ILLNESS:  SHACOLA SCHUSSLER is a 80 y.o. year old female  with COPD, lymphedema, venous insufficiency, venous stasis ulcers, pulmonary HTN, sleep apnea. Patient lives alone at home but has support from family.  Her son comes by often and her cousin, Judeen Hammans, helps with her personal care and cleaning her home.  She  gets DOE with any activity including eating and talking.  Patient has to take rest breaks during our conversation today.  She has PND and sleeps on couch propped up with 2-3 pillows. Denies orthopnea. Has a stable cough.  States that she quit smoking about a month ago. She is not followed by pulmonology for her COPD.  She is on inhalers--Spiriva, [place], and albuterol rescue inhaler.  She has venous stasis ulcers to bilateral lower legs and is being followed by vein and vascular.  She has home health nurse that performs wound care.  She is currently getting Unna boots with [employer].  She is intolerant to zinc and calomine.  She  states her comes and goes.  She has had a 10 pound weight loss over the past 3 months.  Weighed 117 pounds with BMI of 22.11 on 08/22/2020 and states weighing 107 pounds today with BMI of 20.22.  Denies falls, pain, N/V/D, constipation, dysuria, hematuria.  Rest of 10 point ROS asked and negative except what is stated in HPI  History obtained from review of EMR and interview Ms. Rybicki.  I reviewed available labs, medications, imaging, studies and related documents from the EMR.  Records reviewed and summarized above.    Physical Exam:  Constitutional: NAD General: frail appearing, thin EYES: anicteric sclera, lids intact, no discharge  ENMT: intact hearing, oral mucous membranes moist CV: S1S2, RRR, lymphedema to bilateral lower extremities with wraps in place Pulmonary: diminished lung sounds with no adventitous breath sounds heard; does have increased effort to breath when talking and having to rest to catch breath during our converstation Abdomen: normo-active BS + 4 quadrants, soft and non tender, no ascites MSK: moves all extremities, ambulatory Skin: warm and dry, no rashes or wounds on visible skin Neuro:  A and O x 3 Hem/lymph/immuno: no widespread bruising   CURRENT PROBLEM LIST:  Patient Active Problem List   Diagnosis Date Noted  . Hypertension 08/28/2020  . Obstructive sleep apnea 08/28/2020  . Venous ulcer ([place]) 08/29/2018  . Chronic venous insufficiency 07/29/2018  . Lymphedema 07/29/2018  . Chronic respiratory failure with hypoxia ([place]) 12/24/2015  . COPD exacerbation ([place])   . Pulmonary hypertension ([place])   . Acute respiratory failure ([place])   . Hyperlipidemia   . Other abnormal glucose 04/19/2014   PAST MEDICAL HISTORY:  Active Ambulatory Problems    Diagnosis Date Noted  . COPD exacerbation ([place])   . Pulmonary hypertension ([place])   . Acute respiratory failure ([place])   . Hyperlipidemia   . Chronic venous insufficiency 07/29/2018  . Lymphedema 07/29/2018  .  Venous ulcer ([place]) 08/29/2018  . Chronic respiratory failure with hypoxia ([place]) 12/24/2015  . Hypertension 08/28/2020  . Obstructive sleep apnea 08/28/2020  . Other abnormal glucose 04/19/2014   Resolved Ambulatory Problems    Diagnosis Date Noted  . No Resolved Ambulatory Problems   Past Medical History:  Diagnosis Date  . Heart murmur   . Hepatitis   . Pneumonia   . PONV (postoperative nausea and vomiting)   . Shortness of breath dyspnea   . Sleep apnea   . Wears dentures    SOCIAL HX:  Social History   Tobacco Use  . Smoking status: Former Smoker    Packs/day: 1.00    Types: Cigarettes    Quit date: 08/04/2012    Years since quitting: 8.3  . Smokeless tobacco: Never Used  Substance Use Topics  . Alcohol use: Yes    Alcohol/week: 7.0 standard drinks  Types: 7 Glasses of wine per week   FAMILY HX:  Family History  Problem Relation Age of Onset  . Coronary artery disease Father   . Coronary artery disease Mother       ALLERGIES:  Allergies  Allergen Reactions  . Symbicort [Budesonide-Formoterol Fumarate] Shortness Of Breath  . Macrodantin [Nitrofurantoin Macrocrystal] Other (See Comments)    Pt states that this medication put her in kidney failure.    Octaviano Glow Other (See Comments)  . Zinc Itching  . Penicillins Rash     PERTINENT MEDICATIONS:  Outpatient Encounter Medications as of 11/20/2020  Medication Sig  . albuterol (PROVENTIL HFA;VENTOLIN HFA) 108 (90 BASE) MCG/ACT inhaler Inhale 2 puffs into the lungs every 4 (four) hours as needed for wheezing or shortness of breath.  Marland Kitchen aspirin EC 81 MG tablet Take 81 mg by mouth daily.  . cholecalciferol (VITAMIN D) 25 MCG (1000 UT) tablet Take 1,000 Units by mouth daily.  . Fluticasone-Salmeterol (ADVAIR) 250-50 MCG/DOSE AEPB Inhale 1 puff into the lungs 2 (two) times daily.  . furosemide (LASIX) 40 MG tablet Take 40 mg by mouth daily as needed for edema.  . lovastatin (MEVACOR) 20 MG tablet  Take 20 mg by mouth daily.  . potassium chloride (K-DUR,KLOR-CON) 10 MEQ tablet Take 10 mEq by mouth daily as needed (when taking Furosemide).  . potassium chloride (KLOR-CON) 10 MEQ tablet Take by mouth.  . telmisartan (MICARDIS) 80 MG tablet Take 80 mg by mouth daily.  Marland Kitchen tiotropium (SPIRIVA) 18 MCG inhalation capsule Place 18 mcg into inhaler and inhale daily.   No facility-administered encounter medications on file as of 11/20/2020.    Thank you for the opportunity to participate in the care of Ms. Langner.  The palliative care team will continue to follow. Please call our office at [phone] if we can be of additional assistance.   Nyelle Wolfson Jenetta Downer, NP , DNP  This chart was dictated using voice recognition software. Despite best efforts to proofread, errors can occur which can change the documentation meaning.   COVID-19 PATIENT SCREENING TOOL Asked and negative response unless otherwise noted:   Have you had symptoms of covid, tested positive or been in contact with someone with symptoms/positive test in the past 5-10 days? negative

## 2020-12-03 ENCOUNTER — Telehealth: Payer: Self-pay

## 2020-12-03 NOTE — Telephone Encounter (Signed)
5/2 @1130  AM: Palliative care SW outreached patient, per NP request. To assess financial concerns around medications and in home care assistance.  Call unsuccessful. SW left HIPAA compliant VM. Awaiting return call.

## 2020-12-03 NOTE — Telephone Encounter (Signed)
12/03/20 @1140  AM: Patient returned SW phone call.  Patient shared with SW that she is only in need of assistance with her medications and she is aware that she may not be able to get assistance with this. Patient states that she is paying $100 out of pocket each month for her 2 inhalers (Spiriva and Proventil). Patient states that states that these are the cheapest ones she can have that will address her COPD. SW discussed possible patient assistance programs through the manufacturer of these medications. Patient states that she would be interested in receiving this in the mail but that she does not think she wants to continue with palliative care services any longer as she does not see the benefit.   SW to make palliative NP and admin staff aware of patients desire to end services.   SW will mail patient assistance applications for Spiriva and Proventil to patient for her to complete.

## 2020-12-12 ENCOUNTER — Telehealth: Payer: Self-pay | Admitting: Adult Health Nurse Practitioner

## 2020-12-12 NOTE — Telephone Encounter (Signed)
Called to confirm tomorrow's appt at 2:15.  Left VM with reason for call and call back info Brylei Pedley K. Garner Nash NP

## 2020-12-12 NOTE — Telephone Encounter (Signed)
Patient left VM requesting to discontinue palliative services.  Forwarded message to our office. Alice Burnside K. Garner Nash NP

## 2020-12-13 ENCOUNTER — Other Ambulatory Visit: Payer: Medicare Other | Admitting: Adult Health Nurse Practitioner

## 2020-12-13 ENCOUNTER — Other Ambulatory Visit: Payer: Self-pay

## 2021-09-23 ENCOUNTER — Telehealth: Payer: Self-pay

## 2021-09-23 NOTE — Telephone Encounter (Signed)
Spoke with patient and scheduled a Telephonic Palliative Consult for 09/24/21 @ 1:30 PM.  Consent obtained; updated Outlook/Netsmart/Team List and Epic.

## 2021-09-24 ENCOUNTER — Other Ambulatory Visit: Payer: No Typology Code available for payment source | Admitting: Student

## 2021-09-24 ENCOUNTER — Other Ambulatory Visit: Payer: Self-pay

## 2021-09-24 DIAGNOSIS — Z515 Encounter for palliative care: Secondary | ICD-10-CM

## 2021-09-24 DIAGNOSIS — R0602 Shortness of breath: Secondary | ICD-10-CM

## 2021-09-24 NOTE — Progress Notes (Signed)
Therapist, nutritional Palliative Care Consult Note Telephone: [phone]  Fax: [phone]   Date of encounter: 09/24/21 1:41 PM PATIENT NAME: Vanessa Rowe [address] [place] [address]   [phone] (home)  DOB: [birth year] MRN: [id number] PRIMARY CARE PROVIDER:    Lynnea Ferrier, MD,  [address] [employer] [place] [address] [phone]  REFERRING PROVIDER:   Lynnea Ferrier, MD [address] [facility] [place],  [place] [address] [phone]  RESPONSIBLE PARTY:    Contact Information     Name Relation Home Work [place] Daughter [phone]     Ronique, Simerly [phone]     Imari, Reen Daughter   [phone]        Due to the COVID-19 crisis, this visit was done via telemedicine from my office and it was initiated and consent by this patient and or family.  This home visit was completed via telephone due to the patient's inability to connect via an audiovisual connection or their refusal to have an in-person visit. This connection was agreed to by the patient. I verified that I am speaking with the correct person using two identifiers. I discussed the limitations of evaluation and management by telemedicine. The patient expressed understanding and agreed to proceed.                                     ASSESSMENT AND PLAN / RECOMMENDATIONS:   Advance Care Planning/Goals of Care: Goals include to maximize quality of life and symptom management. Patient/health care surrogate gave his/her permission to discuss.Our advance care planning conversation included a discussion about:    The value and importance of advance care planning  Experiences with loved ones who have been seriously ill or have died  Exploration of personal, cultural or spiritual beliefs that might influence medical decisions  Exploration of goals of care in the event of a sudden injury or illness CODE  STATUS: DNR  Patient expressed shortness of breath during phone conversation and she had just completed PT. Arranged for an in home visit to continue discussing her goals and long range planning. Palliative Medicine will continue to provide support.  Symptom Management/Plan:  Dyspnea-due to COPD, chronic respiratory failure. Unable to verify exactly what inhalers she is on as she states they are in another room and she is too short of breath to go get them at the moment. She is encouraged to use inhalers as directed. F/u appointment in person scheduled for tomorrow.   Follow up Palliative Care Visit: Palliative care will continue to follow for complex medical decision making, advance care planning, and clarification of goals. Return 1 weeks or prn.  I spent 18 minutes providing this consultation. More than 50% of the time in this consultation was spent in counseling and care coordination.   HOSPICE ELIGIBILITY/DIAGNOSIS: TBD  Chief Complaint: Palliative Medicine initial consult.   HISTORY OF PRESENT ILLNESS:  Vanessa Rowe is a 81 y.o. year old female  with COPD, chronic respiratory failure, pulmonary hypertension, venous insufficiency with chronic wounds, lymphedema.  Patient resides at home. She is currently receiving [facility] SN services for wounds to LE and PT. Patient is short of breath when speaking on the phone today. She expresses needing additional support to come in the home. She is on room air; she endorses shortness of breath with exertion and at  rest. It takes her a couple of minutes for her breathing to return to baseline. She states she was not able to lift tanks in the past to be filled.   History obtained from review of EMR, discussion with primary team, and interview with family, facility staff/caregiver and/or Vanessa Rowe.  I reviewed available labs, medications, imaging, studies and related documents from the EMR.  Records reviewed and summarized above.   ROS  General:  NAD EYES: denies vision changes ENMT: denies dysphagia Cardiovascular: denies chest pain,  DOE Pulmonary: cough, denies increased SOB Abdomen: denies constipation GU: denies dysuria MSK: increased weakness Skin: wounds to LE Neurological: denies pain Psych: Endorses stable mood Heme/lymph/immuno: denies bruises, abnormal bleeding  Physical Exam:  Deferred due to telephonic visit.   CURRENT PROBLEM LIST:  Patient Active Problem List   Diagnosis Date Noted   Hypertension 08/28/2020   Obstructive sleep apnea 08/28/2020   Venous ulcer (HCC) 08/29/2018   Chronic venous insufficiency 07/29/2018   Lymphedema 07/29/2018   Chronic respiratory failure with hypoxia (HCC) 12/24/2015   COPD exacerbation (HCC)    Pulmonary hypertension (HCC)    Acute respiratory failure (HCC)    Hyperlipidemia    Other abnormal glucose 04/19/2014   PAST MEDICAL HISTORY:  Active Ambulatory Problems    Diagnosis Date Noted   COPD exacerbation (HCC)    Pulmonary hypertension (HCC)    Acute respiratory failure (HCC)    Hyperlipidemia    Chronic venous insufficiency 07/29/2018   Lymphedema 07/29/2018   Venous ulcer (HCC) 08/29/2018   Chronic respiratory failure with hypoxia (HCC) 12/24/2015   Hypertension 08/28/2020   Obstructive sleep apnea 08/28/2020   Other abnormal glucose 04/19/2014   Resolved Ambulatory Problems    Diagnosis Date Noted   No Resolved Ambulatory Problems   Past Medical History:  Diagnosis Date   Heart murmur    Hepatitis    Pneumonia    PONV (postoperative nausea and vomiting)    Shortness of breath dyspnea    Sleep apnea    Wears dentures    SOCIAL HX:  Social History   Tobacco Use   Smoking status: Former    Packs/day: 1.00    Types: Cigarettes    Quit date: 08/04/2012    Years since quitting: 9.1   Smokeless tobacco: Never  Substance Use Topics   Alcohol use: Yes    Alcohol/week: 7.0 standard drinks    Types: 7 Glasses of wine per week   FAMILY HX:   Family History  Problem Relation Age of Onset   Coronary artery disease Father    Coronary artery disease Mother       ALLERGIES:  Allergies  Allergen Reactions   Symbicort [Budesonide-Formoterol Fumarate] Shortness Of Breath   Macrodantin [Nitrofurantoin Macrocrystal] Other (See Comments)    Pt states that this medication put her in kidney failure.     Sulfamethoxazole-Trimethoprim Other (See Comments)   Zinc Itching   Penicillins Rash     PERTINENT MEDICATIONS:  Outpatient Encounter Medications as of 09/24/2021  Medication Sig   albuterol (PROVENTIL HFA;VENTOLIN HFA) 108 (90 BASE) MCG/ACT inhaler Inhale 2 puffs into the lungs every 4 (four) hours as needed for wheezing or shortness of breath.   aspirin EC 81 MG tablet Take 81 mg by mouth daily.   cholecalciferol (VITAMIN D) 25 MCG (1000 UT) tablet Take 1,000 Units by mouth daily.   Fluticasone-Salmeterol (ADVAIR) 250-50 MCG/DOSE AEPB Inhale 1 puff into the lungs 2 (two) times daily.   furosemide (LASIX)  40 MG tablet Take 40 mg by mouth daily as needed for edema.   lovastatin (MEVACOR) 20 MG tablet Take 20 mg by mouth daily.   potassium chloride (K-DUR,KLOR-CON) 10 MEQ tablet Take 10 mEq by mouth daily as needed (when taking Furosemide).   potassium chloride (KLOR-CON) 10 MEQ tablet Take by mouth.   telmisartan (MICARDIS) 80 MG tablet Take 80 mg by mouth daily.   tiotropium (SPIRIVA) 18 MCG inhalation capsule Place 18 mcg into inhaler and inhale daily.   No facility-administered encounter medications on file as of 09/24/2021.   Thank you for the opportunity to participate in the care of Ms. Deen.  The palliative care team will continue to follow. Please call our office at [phone] if we can be of additional assistance.   Luella Cook, NP   COVID-19 PATIENT SCREENING TOOL Asked and negative response unless otherwise noted:  Have you had symptoms of covid, tested positive or been in contact with someone with  symptoms/positive test in the past 5-10 days? No

## 2021-09-25 ENCOUNTER — Other Ambulatory Visit: Payer: No Typology Code available for payment source | Admitting: Student

## 2021-09-25 ENCOUNTER — Other Ambulatory Visit: Payer: Self-pay

## 2021-09-25 DIAGNOSIS — Z515 Encounter for palliative care: Secondary | ICD-10-CM

## 2021-09-25 DIAGNOSIS — R0602 Shortness of breath: Secondary | ICD-10-CM

## 2021-09-25 DIAGNOSIS — R531 Weakness: Secondary | ICD-10-CM

## 2021-09-25 DIAGNOSIS — I890 Lymphedema, not elsewhere classified: Secondary | ICD-10-CM

## 2021-09-25 DIAGNOSIS — J449 Chronic obstructive pulmonary disease, unspecified: Secondary | ICD-10-CM

## 2021-09-25 NOTE — Progress Notes (Signed)
Designer, [employer]ellery Palliative Care Consult Note Telephone: [phone]  Fax: [phone]    Date of encounter: 09/25/21 1:28 PM PATIENT NAME: Vanessa Rowe [address] [place] [address]   [phone] (home)  DOB: Feb 05, 1941 MRN: [id number] PRIMARY CARE PROVIDER:    Adin Hector, MD,  [address] [place] [address] [phone]  REFERRING PROVIDER:   Adin Hector, MD [facility],  [place] [address] [phone]  RESPONSIBLE PARTY:    Contact Information     Name Relation Home Work Belle Daughter [phone]     Aalyiah, [place], [place] Daughter   [phone]        I met face to face with patient in the home. Palliative Care was asked to follow this patient by consultation request of  Adin Hector, MD to address advance care planning and complex medical decision making. This is a follow up visit.                                   ASSESSMENT AND PLAN / RECOMMENDATIONS:   Advance Care Planning/Goals of Care: Goals include to maximize quality of life and symptom management. Patient/health care surrogate gave his/her permission to discuss. Our advance care planning conversation included a discussion about:    The value and importance of advance care planning  Experiences with loved ones who have been seriously ill or have died  Exploration of personal, cultural or spiritual beliefs that might influence medical decisions  Exploration of goals of care in the event of a sudden injury or illness  Review and updating or creation of an  advance directive document . Decision not to resuscitate or to de-escalate disease focused treatments due to poor prognosis. CODE STATUS: DNR   Education provided on Palliative Medicine vs. Hospice services. Patient would like to remain in the home and wants to continue therapy  services to get back to her baseline status. She wants to be able to cook small meals. She wants to continue DNR status; limit hospitalizations if possible. Patient is unclear on long range planning should she continue to decline and need additional support.    I spent 20 minutes providing this consultation. More than 50% of the time in this consultation was spent in counseling and care coordination. ---------------------------------------------------------------------------------------------------------   Symptom Management/Plan:  COPD-patient endorses shortness of breath with exertion and at rest. She is currently using albuterol and ipratropium nebulizer TID; Duoneb is currently on back order. She feels this is managing her shortness of breath. Discussed oxygen concentrator; she does not want a concentrator due to having to fill and lift tanks. She wants a small portable device but states the requirements are rigorous and she does not want to go through process. Will continue to encourage oxygen concentrator should she continue to decline and need supplemental oxygen.   Generalized weakness-continue PT as directed; encourage use of walker or rollator. Monitor for falls safety.  Lymphedema-patient currently receiving [facility] SN services for leg wraps. She has not been using her leg pumps. Continue HH SN services with [facility] as ordered; leg wraps changed twice weekly. Patient encouraged to elevate legs, continue furosemide PRN as directed.  Follow up Palliative Care Visit: Palliative care will continue to follow for complex medical decision making, advance care planning, and  clarification of goals. Return in 6-8 weeks or prn.  This visit was coded based on medical decision making (MDM).  PPS: 50%  HOSPICE ELIGIBILITY/DIAGNOSIS: TBD  Chief Complaint: Palliative Medicine follow up visit.   HISTORY OF PRESENT ILLNESS:  Vanessa Rowe is a 81 y.o. year old female  with COPD, chronic respiratory  failure, pulmonary hypertension, venous insufficiency with chronic wounds, lymphedema.  Patient resides at home; has family bringing food and checking on her daily. Endorses a good appetite; had only been eating once a day. Appetite has improved and is eating at least 2 good meals a day. Uses walker for ambulation. Was able to prepare light meals up until several months ago. Attributes  functional decline to her poor appetite. Denies pain today. Sleeps on couch; sleep is fair. Currently using ipratropium bromide, albuterol tid. Had a fall during the night over a month ago when she got up to turn lights out. PT/SN coming with [facility] home health. Not currently using leg pumps. Reports stopped smoking a couple of months ago. She has 2 children; one local and oldest son is in [place]. Treated for UTI in January and wound infection to legs in past several months. A 10-point review of systems is negative, except for the pertinent positives and negatives detailed in the HPI.   History obtained from review of EMR, discussion with primary team, and interview with family, facility staff/caregiver and/or Ms. Rarick.  I reviewed available labs, medications, imaging, studies and related documents from the EMR.  Records reviewed and summarized above.    Physical Exam: Weighed a week ago; 100 pounds per patient Pulse 116, resp 20, b/p 100/68, sats 91% on room air Constitutional: NAD General: frail appearing, thin EYES: anicteric sclera, lids intact, no discharge  ENMT: intact hearing, oral mucous membranes moist, dentition intact CV: S1S2, RRR,  bilateral LE lymphedema, 3+ edema Pulmonary: upper lobes clear, bases diminished, increased work of breathing with exertion/talking, no cough, room air Abdomen: normo-active BS + 4 quadrants, soft and non tender GU: deferred MSK: +sarcopenia, moves all extremities, ambulatory Skin: warm and dry, bilateral leg wraps present Neuro: generalized weakness, A & O x  3 Psych: non-anxious affect, pleasant Hem/lymph/immuno: no widespread bruising   Thank you for the opportunity to participate in the care of Ms. Debroux.  The palliative care team will continue to follow. Please call our office at [phone] if we can be of additional assistance.   Ezekiel Slocumb, NP   COVID-19 PATIENT SCREENING TOOL Asked and negative response unless otherwise noted:   Have you had symptoms of covid, tested positive or been in contact with someone with symptoms/positive test in the past 5-10 days? No

## 2021-10-23 ENCOUNTER — Other Ambulatory Visit: Payer: No Typology Code available for payment source | Admitting: Student

## 2021-10-23 ENCOUNTER — Other Ambulatory Visit: Payer: Self-pay

## 2021-10-23 DIAGNOSIS — K219 Gastro-esophageal reflux disease without esophagitis: Secondary | ICD-10-CM

## 2021-10-23 DIAGNOSIS — R110 Nausea: Secondary | ICD-10-CM

## 2021-10-23 DIAGNOSIS — J449 Chronic obstructive pulmonary disease, unspecified: Secondary | ICD-10-CM

## 2021-10-23 DIAGNOSIS — Z515 Encounter for palliative care: Secondary | ICD-10-CM

## 2021-10-23 NOTE — Progress Notes (Signed)
? ? ?Manufacturing engineer ?Community Palliative Care Consult Note ?Telephone: [phone]  ?Fax: [phone]  ? ? ?Date of encounter: 10/23/21 ?2:03 PM ?PATIENT NAME: Vanessa Rowe ?[place] [address]   ?[phone] (home)  ?DOB: 1940-12-23 ?MRN: [id number] ?PRIMARY CARE PROVIDER:    ?Adin Hector, MD,  ?[facility]- ?[place] [address] ?[phone] ? ?REFERRING PROVIDER:   ?Adin Hector, MD ?[facility]- ?[place],  [place] [address] ?[phone] ? ?RESPONSIBLE PARTY:    ?Contact Information   ? ? Name Relation Home Work Mobile  ? Rowe,Vanessa Daughter [phone]    ? Rowe,Vanessa Son [phone]    ? Rowe, Vanessa [place] Daughter   [phone]  ? ?  ? ? ? ?I met face to face with patient and family in the home. Palliative Care was asked to follow this patient by consultation request of  Adin Hector, MD to address advance care planning and complex medical decision making. This is a follow up visit. ? ?                                 ASSESSMENT AND PLAN / RECOMMENDATIONS:  ? ?Advance Care Planning/Goals of Care: Goals include to maximize quality of life and symptom management. Patient/health care surrogate gave his/her permission to discuss. ?Our advance care planning conversation included a discussion about:    ?The value and importance of advance care planning  ?Experiences with loved ones who have been seriously ill or have died  ?Exploration of personal, cultural or spiritual beliefs that might influence medical decisions  ?Exploration of goals of care in the event of a sudden injury or illness  ?CODE STATUS: DNR ? ?Symptom Management/Plan: ? ?COPD-patient with shortness of breath; states her breathing has been stable. Continue  albuterol and ipratropium nebulizer TID; Duoneb is currently on back order.  ? ?Lymphedema-patient currently receiving [facility] SN services for leg wraps. She has not been using her leg  pumps. Continue HH SN services with [facility] as ordered; leg wraps changed twice weekly. Patient encouraged to elevate legs, continue furosemide PRN as directed. Continue to elevate legs when sitting.  ? ?Nausea-start ondansetron 4 mg every 8 hours PRN. Bland diet encouraged for the next 48 hours.  ? ?GERD-symptoms consistent with GERD; start omeprazole 20 mg daily. Education provided on diet and triggers.  ? ?Follow up Palliative Care Visit: Palliative care will continue to follow for complex medical decision making, advance care planning, and clarification of goals. Return in 6-8 weeks or prn. ? ? ?This visit was coded based on medical decision making (MDM). ? ?PPS: 40% ? ?HOSPICE ELIGIBILITY/DIAGNOSIS: TBD ? ?Chief Complaint: Palliative Medicine follow up visit.  ? ?HISTORY OF PRESENT ILLNESS:  Vanessa Rowe is a 81 y.o. year old female  with  COPD, chronic respiratory failure, pulmonary hypertension, venous insufficiency with chronic wounds, lymphedema.  ? ?Patient reports not feeling well in the past week. She states she completed antibiotics for a UTI in the past week. She denies urinary complaints. She states she has "been sick on my stomach" from something she ate. She has taken Pepto bismol. She does endorses reflux, belching. Denies any fever or chills. She does report nausea; denies constipation or diarrhea. Endorses a fair appetite. She denies pain. She states her breathing has been stable; endorses shortness of breath with exertion. She currently has [facility]  SN coming out to wrap legs twice weekly due to her lymphedema. A 10-point ROS is negative, except for the pertinent positives and negatives detailed per the HPI.  ? ?History obtained from review of EMR, discussion with primary team, and interview with family, facility staff/caregiver and/or Ms. Kloosterman.  ?I reviewed available labs, medications, imaging, studies and related documents from the EMR.  Records reviewed and summarized above.  ? ? ?Physical  Exam: ?Pulse 66, resp 20, sats 94% on room air ?Constitutional: NAD ?General: frail appearing, thin  ?EYES: anicteric sclera, lids intact, no discharge  ?ENMT: intact hearing, oral mucous membranes moist, dentition intact ?CV: S1S2, RRR, no LE edema ?Pulmonary:  no increased work of breathing, no cough, room air ?Abdomen: intake 100%, normo-active BS + 4 quadrants, soft and non tender, no ascites ?GU: deferred ?MSK: no sarcopenia, moves all extremities, ambulatory ?Skin: warm and dry, no rashes or wounds on visible skin ?Neuro:  no generalized weakness,  no cognitive impairment ?Psych: non-anxious affect, A and O x 3 ?Hem/lymph/immuno: no widespread bruising ? ? ?Thank you for the opportunity to participate in the care of Ms. Fenster.  The palliative care team will continue to follow. Please call our office at [phone] if we can be of additional assistance.  ? ?Ezekiel Slocumb, NP  ? ?COVID-19 PATIENT SCREENING TOOL ?Asked and negative response unless otherwise noted:  ? ?Have you had symptoms of covid, tested positive or been in contact with someone with symptoms/positive test in the past 5-10 days? No ? ?

## 2021-10-24 ENCOUNTER — Emergency Department: Payer: No Typology Code available for payment source

## 2021-10-24 ENCOUNTER — Encounter: Payer: Self-pay | Admitting: Emergency Medicine

## 2021-10-24 ENCOUNTER — Other Ambulatory Visit: Payer: Self-pay

## 2021-10-24 ENCOUNTER — Inpatient Hospital Stay
Admission: EM | Admit: 2021-10-24 | Discharge: 2021-11-02 | DRG: 951 | Disposition: E | Payer: No Typology Code available for payment source | Attending: Osteopathic Medicine | Admitting: Osteopathic Medicine

## 2021-10-24 DIAGNOSIS — N179 Acute kidney failure, unspecified: Secondary | ICD-10-CM

## 2021-10-24 DIAGNOSIS — J9621 Acute and chronic respiratory failure with hypoxia: Secondary | ICD-10-CM | POA: Diagnosis present

## 2021-10-24 DIAGNOSIS — G4733 Obstructive sleep apnea (adult) (pediatric): Secondary | ICD-10-CM | POA: Diagnosis present

## 2021-10-24 DIAGNOSIS — J9600 Acute respiratory failure, unspecified whether with hypoxia or hypercapnia: Secondary | ICD-10-CM | POA: Diagnosis present

## 2021-10-24 DIAGNOSIS — R778 Other specified abnormalities of plasma proteins: Secondary | ICD-10-CM | POA: Diagnosis present

## 2021-10-24 DIAGNOSIS — Z515 Encounter for palliative care: Secondary | ICD-10-CM | POA: Diagnosis present

## 2021-10-24 DIAGNOSIS — L97909 Non-pressure chronic ulcer of unspecified part of unspecified lower leg with unspecified severity: Secondary | ICD-10-CM | POA: Diagnosis not present

## 2021-10-24 DIAGNOSIS — L03116 Cellulitis of left lower limb: Secondary | ICD-10-CM | POA: Diagnosis present

## 2021-10-24 DIAGNOSIS — I83009 Varicose veins of unspecified lower extremity with ulcer of unspecified site: Secondary | ICD-10-CM | POA: Diagnosis not present

## 2021-10-24 DIAGNOSIS — I272 Pulmonary hypertension, unspecified: Secondary | ICD-10-CM

## 2021-10-24 DIAGNOSIS — E46 Unspecified protein-calorie malnutrition: Secondary | ICD-10-CM | POA: Diagnosis present

## 2021-10-24 DIAGNOSIS — E860 Dehydration: Secondary | ICD-10-CM | POA: Diagnosis present

## 2021-10-24 DIAGNOSIS — I4891 Unspecified atrial fibrillation: Secondary | ICD-10-CM | POA: Diagnosis present

## 2021-10-24 DIAGNOSIS — Z66 Do not resuscitate: Secondary | ICD-10-CM | POA: Diagnosis present

## 2021-10-24 DIAGNOSIS — Z90710 Acquired absence of both cervix and uterus: Secondary | ICD-10-CM

## 2021-10-24 DIAGNOSIS — E872 Acidosis, unspecified: Secondary | ICD-10-CM | POA: Diagnosis present

## 2021-10-24 DIAGNOSIS — Z961 Presence of intraocular lens: Secondary | ICD-10-CM | POA: Diagnosis present

## 2021-10-24 DIAGNOSIS — R6520 Severe sepsis without septic shock: Secondary | ICD-10-CM

## 2021-10-24 DIAGNOSIS — E871 Hypo-osmolality and hyponatremia: Secondary | ICD-10-CM | POA: Diagnosis present

## 2021-10-24 DIAGNOSIS — Z7189 Other specified counseling: Secondary | ICD-10-CM | POA: Diagnosis not present

## 2021-10-24 DIAGNOSIS — Z681 Body mass index (BMI) 19 or less, adult: Secondary | ICD-10-CM | POA: Diagnosis not present

## 2021-10-24 DIAGNOSIS — R6521 Severe sepsis with septic shock: Secondary | ICD-10-CM | POA: Diagnosis present

## 2021-10-24 DIAGNOSIS — I10 Essential (primary) hypertension: Secondary | ICD-10-CM | POA: Diagnosis present

## 2021-10-24 DIAGNOSIS — A419 Sepsis, unspecified organism: Secondary | ICD-10-CM | POA: Diagnosis present

## 2021-10-24 DIAGNOSIS — J449 Chronic obstructive pulmonary disease, unspecified: Secondary | ICD-10-CM | POA: Diagnosis present

## 2021-10-24 DIAGNOSIS — R64 Cachexia: Secondary | ICD-10-CM | POA: Diagnosis present

## 2021-10-24 DIAGNOSIS — L03115 Cellulitis of right lower limb: Secondary | ICD-10-CM | POA: Diagnosis present

## 2021-10-24 DIAGNOSIS — Z9841 Cataract extraction status, right eye: Secondary | ICD-10-CM

## 2021-10-24 DIAGNOSIS — E875 Hyperkalemia: Secondary | ICD-10-CM | POA: Diagnosis present

## 2021-10-24 DIAGNOSIS — I872 Venous insufficiency (chronic) (peripheral): Secondary | ICD-10-CM | POA: Diagnosis present

## 2021-10-24 DIAGNOSIS — J9601 Acute respiratory failure with hypoxia: Secondary | ICD-10-CM | POA: Diagnosis not present

## 2021-10-24 DIAGNOSIS — E785 Hyperlipidemia, unspecified: Secondary | ICD-10-CM | POA: Diagnosis present

## 2021-10-24 DIAGNOSIS — Z20822 Contact with and (suspected) exposure to covid-19: Secondary | ICD-10-CM | POA: Diagnosis present

## 2021-10-24 DIAGNOSIS — I890 Lymphedema, not elsewhere classified: Secondary | ICD-10-CM | POA: Diagnosis present

## 2021-10-24 DIAGNOSIS — Z87891 Personal history of nicotine dependence: Secondary | ICD-10-CM

## 2021-10-24 DIAGNOSIS — Z8249 Family history of ischemic heart disease and other diseases of the circulatory system: Secondary | ICD-10-CM

## 2021-10-24 DIAGNOSIS — Z9842 Cataract extraction status, left eye: Secondary | ICD-10-CM

## 2021-10-24 HISTORY — DX: Acute kidney failure, unspecified: N17.9

## 2021-10-24 LAB — CBC WITH DIFFERENTIAL/PLATELET
Abs Immature Granulocytes: 0.08 10*3/uL — ABNORMAL HIGH (ref 0.00–0.07)
Basophils Absolute: 0.1 10*3/uL (ref 0.0–0.1)
Basophils Relative: 1 %
Eosinophils Absolute: 0.1 10*3/uL (ref 0.0–0.5)
Eosinophils Relative: 1 %
HCT: 48.2 % — ABNORMAL HIGH (ref 36.0–46.0)
Hemoglobin: 16 g/dL — ABNORMAL HIGH (ref 12.0–15.0)
Immature Granulocytes: 1 %
Lymphocytes Relative: 5 %
Lymphs Abs: 0.5 10*3/uL — ABNORMAL LOW (ref 0.7–4.0)
MCH: 32 pg (ref 26.0–34.0)
MCHC: 33.2 g/dL (ref 30.0–36.0)
MCV: 96.4 fL (ref 80.0–100.0)
Monocytes Absolute: 0.6 10*3/uL (ref 0.1–1.0)
Monocytes Relative: 6 %
Neutro Abs: 8.4 10*3/uL — ABNORMAL HIGH (ref 1.7–7.7)
Neutrophils Relative %: 86 %
Platelets: 236 10*3/uL (ref 150–400)
RBC: 5 MIL/uL (ref 3.87–5.11)
RDW: 13.2 % (ref 11.5–15.5)
WBC: 9.7 10*3/uL (ref 4.0–10.5)
nRBC: 0 % (ref 0.0–0.2)

## 2021-10-24 LAB — COMPREHENSIVE METABOLIC PANEL
ALT: 14 U/L (ref 0–44)
AST: 39 U/L (ref 15–41)
Albumin: 2.7 g/dL — ABNORMAL LOW (ref 3.5–5.0)
Alkaline Phosphatase: 87 U/L (ref 38–126)
Anion gap: 14 (ref 5–15)
BUN: 146 mg/dL — ABNORMAL HIGH (ref 8–23)
CO2: 19 mmol/L — ABNORMAL LOW (ref 22–32)
Calcium: 7.9 mg/dL — ABNORMAL LOW (ref 8.9–10.3)
Chloride: 101 mmol/L (ref 98–111)
Creatinine, Ser: 2.84 mg/dL — ABNORMAL HIGH (ref 0.44–1.00)
GFR, Estimated: 16 mL/min — ABNORMAL LOW (ref >60)
Glucose, Bld: 109 mg/dL — ABNORMAL HIGH (ref 70–99)
Potassium: >7.5 mmol/L (ref 3.5–5.1)
Sodium: 134 mmol/L — ABNORMAL LOW (ref 135–145)
Total Bilirubin: 1.9 mg/dL — ABNORMAL HIGH (ref 0.3–1.2)
Total Protein: 5.9 g/dL — ABNORMAL LOW (ref 6.5–8.1)

## 2021-10-24 LAB — PROTIME-INR
INR: 1.1 (ref 0.8–1.2)
Prothrombin Time: 14.7 seconds (ref 11.4–15.2)

## 2021-10-24 LAB — APTT: aPTT: 25 seconds (ref 24–36)

## 2021-10-24 LAB — TROPONIN I (HIGH SENSITIVITY): Troponin I (High Sensitivity): 343 ng/L (ref <18)

## 2021-10-24 LAB — RESP PANEL BY RT-PCR (FLU A&B, COVID) ARPGX2
Influenza A by PCR: NEGATIVE
Influenza B by PCR: NEGATIVE
SARS Coronavirus 2 by RT PCR: NEGATIVE

## 2021-10-24 LAB — LACTIC ACID, PLASMA: Lactic Acid, Venous: 2.3 mmol/L (ref 0.5–1.9)

## 2021-10-24 MED ORDER — GLYCOPYRROLATE 0.2 MG/ML IJ SOLN
0.2000 mg | INTRAMUSCULAR | Status: DC | PRN
  Filled 2021-10-24: qty 1

## 2021-10-24 MED ORDER — HALOPERIDOL LACTATE 5 MG/ML IJ SOLN: 0.5000 mg | INTRAMUSCULAR | Status: DC | PRN

## 2021-10-24 MED ORDER — SODIUM CHLORIDE 0.9 % IV SOLN
2.0000 g | Freq: Once | INTRAVENOUS | Status: AC
  Administered 2021-10-24: 2 g via INTRAVENOUS
  Filled 2021-10-24: qty 2

## 2021-10-24 MED ORDER — MORPHINE 100MG IN NS 100ML (1MG/ML) PREMIX INFUSION
5.0000 mg/h | INTRAVENOUS | Status: DC
  Administered 2021-10-24: 1 mg/h via INTRAVENOUS
  Filled 2021-10-24: qty 100

## 2021-10-24 MED ORDER — GLYCOPYRROLATE 1 MG PO TABS
1.0000 mg | ORAL_TABLET | ORAL | Status: DC | PRN
  Filled 2021-10-24: qty 1

## 2021-10-24 MED ORDER — AMIODARONE HCL IN DEXTROSE 360-4.14 MG/200ML-% IV SOLN
60.0000 mg/h | INTRAVENOUS | Status: DC
  Administered 2021-10-24 (×2): 60 mg/h via INTRAVENOUS
  Filled 2021-10-24 (×2): qty 200

## 2021-10-24 MED ORDER — ONDANSETRON HCL 4 MG/2ML IJ SOLN: 4.0000 mg | Freq: Four times a day (QID) | INTRAMUSCULAR | Status: DC | PRN

## 2021-10-24 MED ORDER — HALOPERIDOL 0.5 MG PO TABS
0.5000 mg | ORAL_TABLET | ORAL | Status: DC | PRN
  Filled 2021-10-24: qty 1

## 2021-10-24 MED ORDER — LACTATED RINGERS IV SOLN: INTRAVENOUS | Status: DC

## 2021-10-24 MED ORDER — POLYVINYL ALCOHOL 1.4 % OP SOLN
1.0000 [drp] | Freq: Four times a day (QID) | OPHTHALMIC | Status: DC | PRN
  Filled 2021-10-24: qty 15

## 2021-10-24 MED ORDER — HALOPERIDOL LACTATE 2 MG/ML PO CONC
0.5000 mg | ORAL | Status: DC | PRN
  Filled 2021-10-24: qty 0.3

## 2021-10-24 MED ORDER — ACETAMINOPHEN 650 MG RE SUPP: 650.0000 mg | Freq: Four times a day (QID) | RECTAL | Status: DC | PRN

## 2021-10-24 MED ORDER — VANCOMYCIN HCL IN DEXTROSE 1-5 GM/200ML-% IV SOLN
1000.0000 mg | Freq: Once | INTRAVENOUS | Status: AC
  Administered 2021-10-24: 1000 mg via INTRAVENOUS
  Filled 2021-10-24: qty 200

## 2021-10-24 MED ORDER — LACTATED RINGERS IV BOLUS (SEPSIS)
1000.0000 mL | Freq: Once | INTRAVENOUS | Status: AC
  Administered 2021-10-24: 1000 mL via INTRAVENOUS

## 2021-10-24 MED ORDER — AMIODARONE LOAD VIA INFUSION
150.0000 mg | Freq: Once | INTRAVENOUS | Status: AC
  Administered 2021-10-24: 150 mg via INTRAVENOUS
  Filled 2021-10-24: qty 83.34

## 2021-10-24 MED ORDER — MORPHINE BOLUS VIA INFUSION
1.0000 mg | INTRAVENOUS | Status: DC | PRN
  Administered 2021-10-24: 1 mg via INTRAVENOUS
  Filled 2021-10-24: qty 1

## 2021-10-24 MED ORDER — GLYCOPYRROLATE 0.2 MG/ML IJ SOLN
0.2000 mg | INTRAMUSCULAR | Status: DC | PRN
  Administered 2021-10-24: 0.2 mg via INTRAVENOUS
  Filled 2021-10-24 (×2): qty 1

## 2021-10-24 MED ORDER — ONDANSETRON 4 MG PO TBDP: 4.0000 mg | ORAL_TABLET | Freq: Four times a day (QID) | ORAL | Status: DC | PRN

## 2021-10-24 MED ORDER — LACTATED RINGERS IV BOLUS (SEPSIS)
500.0000 mL | Freq: Once | INTRAVENOUS | Status: AC
  Administered 2021-10-24: 500 mL via INTRAVENOUS

## 2021-10-24 MED ORDER — AMIODARONE HCL IN DEXTROSE 360-4.14 MG/200ML-% IV SOLN: 30.0000 mg/h | INTRAVENOUS | Status: DC

## 2021-10-24 MED ORDER — METRONIDAZOLE 500 MG/100ML IV SOLN
500.0000 mg | Freq: Once | INTRAVENOUS | Status: AC
  Administered 2021-10-24: 500 mg via INTRAVENOUS
  Filled 2021-10-24: qty 100

## 2021-10-24 MED ORDER — BIOTENE DRY MOUTH MT LIQD
15.0000 mL | OROMUCOSAL | Status: DC | PRN
  Filled 2021-10-24: qty 15

## 2021-10-24 MED ORDER — ACETAMINOPHEN 325 MG PO TABS: 650.0000 mg | ORAL_TABLET | Freq: Four times a day (QID) | ORAL | Status: DC | PRN

## 2021-10-25 LAB — BLOOD CULTURE ID PANEL (REFLEXED) - BCID2

## 2021-10-27 LAB — CULTURE, BLOOD (ROUTINE X 2)

## 2021-11-02 NOTE — Discharge Summary (Addendum)
Physician Death Summary  ?Patient ID: ?Vanessa Rowe ?MRN: [id number] ?DOB/AGE: 80/18/1942 80 y.o. ? ?Admit date: 2021/11/21 ?Discharge date / date of death: November 21, 2021 ? ?Admission Diagnoses:  ?Principal Problem: ?  Admission for hospice care ?Active Problems: ?  Pulmonary hypertension (HCC) ?  Acute respiratory failure (HCC) ?  Chronic venous insufficiency ?  Lymphedema ?  Venous ulcer (HCC) ?  Goals of care, counseling/discussion ?  AKI (acute kidney injury) (HCC) ?  Severe sepsis (HCC) ? ? ?Discharge Condition: deceased ? ?Hospital Course:  ?RECORD REVIEW AND HOSPITAL COURSE: ?[name] is a 81 y.o. female with a known past medical history of COPD and chronic bilateral lower extremity wounds who presents via EMS after her home health care nurse found her to be altered today.  Also found her to be febrile to 102 in route.  Patient's initial oxygen saturation was 80% when they arrived and increased to 100% on 15 L nonrebreather.  Patient was only complaining of pain but would not describe where it is to ED physician. Patient not answering any other questions for ED physician  ?Pt under palliative care at home for end stage COPD. She is DNR.  In ED, was given broad-spectrum antibiotics, fluids, morphine, but given respiratory distress, evidence of multisystem organ failure, poor prognosis ED physician discussed with family and decision was made to admit patient for comfort care ?Patient seen and examined at bedside in ED, she was in respiratory distress, she was awake and responsive to painful stimuli but she was not answering questions.  Spoke with son, French Ana, who confirmed above discussion and requested comfort care for the patient, no aggressive medical intervention ? ?Consults: None ? ? ?Treatments: comfort measures as above ? ?Discharge Exam: ?Nurse pronounced patient deceased at 17:44 ? ?Disposition: deceased  ? ? ? ? ?Signed: ?Sunnie Nielsen ?11-21-21, 7:47 PM ? ? ?

## 2021-11-02 NOTE — H&P (Signed)
? ? ?HISTORY AND PHYSICAL ? ?Patient: Vanessa Rowe 81 y.o. female ?MRN: [id number] ? ?Today is hospital day 0 after admission on 02/22/22 10:10 AM ? ?RECORD REVIEW AND HOSPITAL COURSE: ?Vanessa Dolinatricia Y Boulden is a 81 y.o. female with a known past medical history of COPD and chronic bilateral lower extremity wounds who presents via EMS after her home health care nurse found her to be altered today.  Also found her to be febrile to 102 in route.  Patient's initial oxygen saturation was 80% when they arrived and increased to 100% on 15 L nonrebreather.  Patient was only complaining of pain but would not describe where it is to ED physician. Patient not answering any other questions for ED physician  ?Pt under palliative care at home for end stage COPD. She is DNR.  In ED, was given broad-spectrum antibiotics, fluids, morphine, but given respiratory distress, evidence of multisystem organ failure, poor prognosis ED physician discussed with family and decision was made to admit patient for comfort care ? ? ?Consultants:  ?none ? ? ? ?SUBJECTIVE:  ?Patient seen and examined at bedside in ED, she is in respiratory distress, she appears to be awake and is responsive to painful stimuli but she is not answering questions.  Spoke with son, French Anaracy, who confirms above discussion and requests comfort care for the patient, no aggressive medical intervention ? ? ? ? ?ASSESSMENT & PLAN ? ?Admission for hospice care ?Multisystem organ failure likely as result of sepsis from lower extremity cellulitis, complicated by severe hyperkalemia, AKI, hyponatremia, malnutrition and dehydration.  History of end-stage COPD.  Patient being admitted for comfort measures, confirmed with next of kin, son, Dillon Bjorkracy Manness, via phone and all questions were answered.  Will maintain morphine for pain control, haloperidol for agitation, acetaminophen for fever, ondansetron for nausea.  ? ?Patient Active Problem List  ? Diagnosis Date Noted  ? Goals of  care, counseling/discussion 007/22/23  ? Admission for hospice care 007/22/23  ? AKI (acute kidney injury) (HCC) 007/22/23  ? Severe sepsis (HCC) 007/22/23  ? Hypertension 08/28/2020  ? Obstructive sleep apnea 08/28/2020  ? Venous ulcer (HCC) 08/29/2018  ? Chronic venous insufficiency 07/29/2018  ? Lymphedema 07/29/2018  ? Chronic respiratory failure with hypoxia (HCC) 12/24/2015  ? COPD exacerbation (HCC)   ? Pulmonary hypertension (HCC)   ? Acute respiratory failure (HCC)   ? Hyperlipidemia   ? Other abnormal glucose 04/19/2014  ? ? ? ?VTE Ppx: none ?CODE STATUS   Code Status: DNR ?Admitted from: home ?Expected Dispo: comfort care  ?Barriers to discharge: comfort care, anticipate hospital death ?Family communication: spoke with son, French Anaracy, by phone.  All questions answered, let him know I am on service today until 8 PM if any additional discussion is needed I am happy to help ? ? ? ? ? ? ? ? ? ? ? ? ? ?Past Medical History:  ?Diagnosis Date  ? Acute respiratory failure (HCC)   ? COPD exacerbation (HCC)   ? Heart murmur   ? when she was 81 years old  ? Hepatitis   ? Hepatitis A (in the 70s)  ? Hyperlipidemia   ? Hypertension   ? Lymphedema   ? lower legs and feet  ? Pneumonia   ? PONV (postoperative nausea and vomiting)   ? Pulmonary hypertension (HCC)   ? Shortness of breath dyspnea   ? Sleep apnea   ? Wears dentures   ? full upper  ? ? ?Past Surgical History:  ?Procedure  Laterality Date  ? ABDOMINAL HYSTERECTOMY    ? APPENDECTOMY    ? BREAST SURGERY    ? bilateral implants  ? CATARACT EXTRACTION W/PHACO Left 08/16/2019  ? Procedure: CATARACT EXTRACTION PHACO AND INTRAOCULAR LENS PLACEMENT (IOC) LEFT 7.41  00:36.8;  Surgeon: Galen Manila, MD;  Location: [facility];  Service: Ophthalmology;  Laterality: Left;  ? EYE SURGERY Right   ? detached retina and lens and cataract Surgery  ? TONSILLECTOMY    ? ? ?Family History  ?Problem Relation Age of Onset  ? Coronary artery disease Father   ? Coronary  artery disease Mother   ? ?Social History:  reports that she quit smoking about 9 years ago. Her smoking use included cigarettes. She smoked an average of 1 pack per day. She has never used smokeless tobacco. She reports current alcohol use of about 7.0 standard drinks per week. She reports that she does not use drugs. ? ?Allergies:  ?Allergies  ?Allergen Reactions  ? Symbicort [Budesonide-Formoterol Fumarate] Shortness Of Breath  ? Macrodantin [Nitrofurantoin Macrocrystal] Other (See Comments)  ?  Pt states that this medication put her in kidney failure.    ? Sulfamethoxazole-Trimethoprim Other (See Comments)  ? Zinc Itching  ? Penicillins Rash  ? ? ?(Not in a hospital admission) ? ? ?Results for orders placed or performed during the hospital encounter of Nov 12, 2021 (from the past 48 hour(s))  ?Lactic acid, plasma     Status: Abnormal  ? Collection Time: 11-12-21 10:27 AM  ?Result Value Ref Range  ? Lactic Acid, Venous 2.3 (HH) 0.5 - 1.9 mmol/L  ?  Comment: CRITICAL RESULT CALLED TO, READ BACK BY AND VERIFIED WITH ?BILL SMITH AT 1118 2021-11-12 DAS ?Performed at [facility], [address], [place], [place] [address] ?  ?Comprehensive metabolic panel     Status: Abnormal  ? Collection Time: 12-Nov-2021 10:27 AM  ?Result Value Ref Range  ? Sodium 134 (L) 135 - 145 mmol/L  ? Potassium >7.5 (HH) 3.5 - 5.1 mmol/L  ?  Comment: HEMOLYSIS AT THIS LEVEL MAY AFFECT RESULT ?CRITICAL RESULT CALLED TO, READ BACK BY AND VERIFIED WITH ?BILL SMITH AT 1118 11-12-21 DAS ?  ? Chloride 101 98 - 111 mmol/L  ? CO2 19 (L) 22 - 32 mmol/L  ? Glucose, Bld 109 (H) 70 - 99 mg/dL  ?  Comment: Glucose reference range applies only to samples taken after fasting for at least 8 hours.  ? BUN 146 (H) 8 - 23 mg/dL  ?  Comment: RESULT CONFIRMED BY MANUAL DILUTION DAS  ? Creatinine, Ser 2.84 (H) 0.44 - 1.00 mg/dL  ? Calcium 7.9 (L) 8.9 - 10.3 mg/dL  ? Total Protein 5.9 (L) 6.5 - 8.1 g/dL  ? Albumin 2.7 (L) 3.5 - 5.0 g/dL  ? AST 39 15 - 41 U/L  ?  ALT 14 0 - 44 U/L  ? Alkaline Phosphatase 87 38 - 126 U/L  ? Total Bilirubin 1.9 (H) 0.3 - 1.2 mg/dL  ? GFR, Estimated 16 (L) >60 mL/min  ?  Comment: (NOTE) ?Calculated using the CKD-EPI Creatinine Equation (2021) ?  ? Anion gap 14 5 - 15  ?  Comment: Performed at [facility], [address] [place], [place] [address]  ?CBC WITH DIFFERENTIAL     Status: Abnormal  ? Collection Time: 11-12-2021 10:27 AM  ?Result Value Ref Range  ? WBC 9.7 4.0 - 10.5 K/uL  ?  Comment: WHITE COUNT CONFIRMED ON SMEAR  ? RBC  5.00 3.87 - 5.11 MIL/uL  ? Hemoglobin 16.0 (H) 12.0 - 15.0 g/dL  ? HCT 48.2 (H) 36.0 - 46.0 %  ? MCV 96.4 80.0 - 100.0 fL  ? MCH 32.0 26.0 - 34.0 pg  ? MCHC 33.2 30.0 - 36.0 g/dL  ? RDW 13.2 11.5 - 15.5 %  ? Platelets 236 150 - 400 K/uL  ?  Comment: PLATELET COUNT CONFIRMED BY SMEAR  ? nRBC 0.0 0.0 - 0.2 %  ? Neutrophils Relative % 86 %  ? Neutro Abs 8.4 (H) 1.7 - 7.7 K/uL  ? Lymphocytes Relative 5 %  ? Lymphs Abs 0.5 (L) 0.7 - 4.0 K/uL  ? Monocytes Relative 6 %  ? Monocytes Absolute 0.6 0.1 - 1.0 K/uL  ? Eosinophils Relative 1 %  ? Eosinophils Absolute 0.1 0.0 - 0.5 K/uL  ? Basophils Relative 1 %  ? Basophils Absolute 0.1 0.0 - 0.1 K/uL  ? Immature Granulocytes 1 %  ? Abs Immature Granulocytes 0.08 (H) 0.00 - 0.07 K/uL  ?  Comment: Performed at [facility], [address] [place], [place] [address]  ?Protime-INR     Status: None  ? Collection Time: November 23, 2021 10:27 AM  ?Result Value Ref Range  ? Prothrombin Time 14.7 11.4 - 15.2 seconds  ? INR 1.1 0.8 - 1.2  ?  Comment: (NOTE) ?INR goal varies based on device and disease states. ?Performed at [facility], [address] [place], ?[place] [address] ?  ?APTT     Status: None  ? Collection Time: 11/23/21 10:27 AM  ?Result Value Ref Range  ? aPTT 25 24 - 36 seconds  ?  Comment: Performed at [facility], [address], [place], [place] [address]  ?Troponin I (High Sensitivity)     Status: Abnormal  ? Collection Time: 11-23-21  10:27 AM  ?Result Value Ref Range  ? Troponin I (High Sensitivity) 343 (HH) <18 ng/L  ?  Comment: CRITICAL RESULT CALLED TO, READ BACK BY AND VERIFIED WITH ?BILL SMITH AT 1118 11-23-2021 DAS ?(NOTE) ?Elevated high

## 2021-11-02 NOTE — Sepsis Progress Note (Signed)
Code Sepsis protocol being monitored by eLink. 

## 2021-11-02 NOTE — ED Notes (Signed)
Dr bradler notified in person of pts labs, K7.5, trop 343, LA 2.3. edp tp bedside to talk to pts son  ?

## 2021-11-02 NOTE — Progress Notes (Signed)
CODE SEPSIS - PHARMACY COMMUNICATION ? ?**Broad Spectrum Antibiotics should be administered within 1 hour of Sepsis diagnosis** ? ?Time Code Sepsis Called/Page Received: 1021 ? ?Antibiotics Ordered: cefepime, vancomycin, metronidazole ? ?Time of 1st antibiotic administration: 1038 ? ? ? ?Raiford Noble ,PharmD ?Clinical Pharmacist  ?11/22/21  10:23 AM ? ?

## 2021-11-02 NOTE — Progress Notes (Deleted)
Anticipate hospital death in next 1-2 days ?

## 2021-11-02 NOTE — ED Notes (Signed)
Pt's son updated on POC, warm blankets provided. ?

## 2021-11-02 NOTE — Assessment & Plan Note (Signed)
Multisystem organ failure likely as result of sepsis from lower extremity cellulitis, complicated by severe hyperkalemia, AKI, hyponatremia, malnutrition and dehydration.  History of end-stage COPD.  Patient being admitted for comfort measures, confirmed with next of kin, son, Dillon Bjork, via phone and all questions were answered.  Will maintain morphine for pain control, haloperidol for agitation, acetaminophen for fever, ondansetron for nausea.  ?

## 2021-11-02 NOTE — ED Triage Notes (Signed)
Pt ems from home for AMS, fever. Per ems pt 80% RA when they arrived. Pt placed on 15 LNRB, sats 100% here. CBG for ems 124. ?

## 2021-11-02 NOTE — Progress Notes (Signed)
PHARMACY -  BRIEF ANTIBIOTIC NOTE  ? ?Pharmacy has received consult(s) for vancomycin and cefepime from an ED provider.  The patient's profile has been reviewed for ht/wt/allergies/indication/available labs. Pt has mild allergy listed to PCN (rash); cefepime has already been administered.  ? ?One time order(s) placed for: ?Cefepime 2 g  ?Vancomycin 1 g ? ?Further antibiotics/pharmacy consults should be ordered by admitting physician if indicated.       ?                ?Thank you, ?Aydien Majette O Onaje Warne ?10-25-2021  10:23 AM ? ?

## 2021-11-02 NOTE — ED Provider Notes (Signed)
? ?[facility] ?Provider Note ? ? Event Date/Time  ? First MD Initiated Contact with Patient 11/16/2021 1020   ?  (approximate) ?History  ?Altered Mental Status and Fever ? ?HPI ?Vanessa Rowe is a 81 y.o. female with a known past medical history of COPD and chronic bilateral lower extremity wounds who presents via EMS after her home health care nurse found her to be altered today.  Also found her to be febrile to 102 in route.  Patient's initial oxygen saturation was 80% when they arrived and increased to 100% on 15 L nonrebreather.  Patient only complaining of pain but will not describe where it is.  Patient not answering any other questions at this time ?Physical Exam  ?Triage Vital Signs: ?ED Triage Vitals  ?Enc Vitals Group  ?   BP --   ?   Pulse Rate 16-Nov-2021 1024 (!) 165  ?   Resp --   ?   Temp 11-16-21 1024 (!) 102.4 ?F (39.1 ?C)  ?   Temp Source 11-16-21 1024 Rectal  ?   SpO2 2021-11-16 1024 99 %  ?   Weight 2021/11/16 1044 85 lb (38.6 kg)  ?   Height 16-Nov-2021 1023 5\' 1"  (1.549 m)  ?   Head Circumference --   ?   Peak Flow --   ?   Pain Score --   ?   Pain Loc --   ?   Pain Edu? --   ?   Excl. in GC? --   ? ?Most recent vital signs: ?Vitals:  ? 11-16-21 1500 2021/11/16 1515  ?BP: (!) 66/49 (!) 67/55  ?Pulse: (!) 58   ?Resp: 13 (!) 21  ?Temp:    ?SpO2: 91%   ? ?General: Awake, oriented x0 ?CV:  Good peripheral perfusion.  ?Resp:  Increased effort.  Mild wheezing over bilateral lung fields.  Nonrebreather in place ?Abd:  No distention.  ?Other:  Elderly cachectic Caucasian female laying in bed in no acute distress.  Bilateral lower extremity significantly edematous with dressings in place and underneath showing evidence of erythema without any open wounds ?ED Results / Procedures / Treatments  ?Labs ?(all labs ordered are listed, but only abnormal results are displayed) ?Labs Reviewed  ?LACTIC ACID, PLASMA - Abnormal; Notable for the following components:  ?    Result Value  ? Lactic Acid,  Venous 2.3 (*)   ? All other components within normal limits  ?COMPREHENSIVE METABOLIC PANEL - Abnormal; Notable for the following components:  ? Sodium 134 (*)   ? Potassium >7.5 (*)   ? CO2 19 (*)   ? Glucose, Bld 109 (*)   ? BUN 146 (*)   ? Creatinine, Ser 2.84 (*)   ? Calcium 7.9 (*)   ? Total Protein 5.9 (*)   ? Albumin 2.7 (*)   ? Total Bilirubin 1.9 (*)   ? GFR, Estimated 16 (*)   ? All other components within normal limits  ?CBC WITH DIFFERENTIAL/PLATELET - Abnormal; Notable for the following components:  ? Hemoglobin 16.0 (*)   ? HCT 48.2 (*)   ? Neutro Abs 8.4 (*)   ? Lymphs Abs 0.5 (*)   ? Abs Immature Granulocytes 0.08 (*)   ? All other components within normal limits  ?TROPONIN I (HIGH SENSITIVITY) - Abnormal; Notable for the following components:  ? Troponin I (High Sensitivity) 343 (*)   ? All other components within normal limits  ?RESP PANEL BY RT-PCR (FLU A&B,  COVID) [id number]  ?CULTURE, BLOOD (ROUTINE X 2)  ?CULTURE, BLOOD (ROUTINE X 2)  ?URINE CULTURE  ?PROTIME-INR  ?APTT  ?LACTIC ACID, PLASMA  ?URINALYSIS, COMPLETE (UACMP) WITH MICROSCOPIC  ?TROPONIN I (HIGH SENSITIVITY)  ? ?EKG ?ED ECG REPORT ?I, Merwyn Katos, the attending physician, personally viewed and interpreted this ECG. ?Date: 02-Nov-2021 ?EKG Time: 1026 ?Rate: 186 ?Rhythm: Atrial fibrillation with rapid ventricular response ?QRS Axis: normal ?Intervals: normal ?ST/T Wave abnormalities: normal ?Narrative Interpretation: Atrial fibrillation with rapid ventricular response.  No evidence of acute ischemia ?RADIOLOGY ?ED MD interpretation: One-view portable chest x-ray interpreted by me shows no evidence of acute abnormalities including no pneumonia, pneumothorax, or widened mediastinum ?-Agree with radiology assessment ?Official radiology report(s): ?DG Chest Port 1 View ? ?Result Date: 02-Nov-2021 ?CLINICAL DATA:  Questionable sepsis EXAM: PORTABLE CHEST 1 VIEW COMPARISON:  01/03/2015 FINDINGS: Artifact from EKG leads and calcified breast  implants. There is no edema, consolidation, effusion, or pneumothorax. Chronic biapical pleural thickening asymmetric to the left. Normal heart size and mediastinal contours. IMPRESSION: No evidence of active disease. Electronically Signed   By: Tiburcio Pea M.D.   On: Nov 02, 2021 11:27   ?PROCEDURES: ?Critical Care performed: Yes, see critical care procedure note(s) ?.1-3 Lead EKG Interpretation ?Performed by: Merwyn Katos, MD ?Authorized by: Merwyn Katos, MD  ? ?  Interpretation: abnormal   ?  ECG rate:  121 ?  ECG rate assessment: tachycardic   ?  Rhythm: atrial fibrillation   ?  Ectopy: none   ?  Conduction: normal   ?CRITICAL CARE ?Performed by: Merwyn Katos ? ?Total critical care time: 45 minutes ? ?Critical care time was exclusive of separately billable procedures and treating other patients. ? ?Critical care was necessary to treat or prevent imminent or life-threatening deterioration. ? ?Critical care was time spent personally by me on the following activities: development of treatment plan with patient and/or surrogate as well as nursing, discussions with consultants, evaluation of patient's response to treatment, examination of patient, obtaining history from patient or surrogate, ordering and performing treatments and interventions, ordering and review of laboratory studies, ordering and review of radiographic studies, pulse oximetry and re-evaluation of patient's condition. ? ?MEDICATIONS ORDERED IN ED: ?Medications  ?lactated ringers infusion ( Intravenous New Bag/Given 11/02/21 1140)  ?amiodarone (NEXTERONE) 1.8 mg/mL load via infusion 150 mg (150 mg Intravenous Bolus from Bag 11/02/21 1115)  ?  Followed by  ?amiodarone (NEXTERONE PREMIX) 360-4.14 MG/200ML-% (1.8 mg/mL) IV infusion (60 mg/hr Intravenous New Bag/Given 2021/11/02 1416)  ?  Followed by  ?amiodarone (NEXTERONE PREMIX) 360-4.14 MG/200ML-% (1.8 mg/mL) IV infusion (has no administration in time range)  ?acetaminophen (TYLENOL) tablet 650  mg (has no administration in time range)  ?  Or  ?acetaminophen (TYLENOL) suppository 650 mg (has no administration in time range)  ?haloperidol (HALDOL) tablet 0.5 mg (has no administration in time range)  ?  Or  ?haloperidol (HALDOL) 2 MG/ML solution 0.5 mg (has no administration in time range)  ?  Or  ?haloperidol lactate (HALDOL) injection 0.5 mg (has no administration in time range)  ?ondansetron (ZOFRAN-ODT) disintegrating tablet 4 mg (has no administration in time range)  ?  Or  ?ondansetron (ZOFRAN) injection 4 mg (has no administration in time range)  ?glycopyrrolate (ROBINUL) tablet 1 mg (has no administration in time range)  ?  Or  ?glycopyrrolate (ROBINUL) injection 0.2 mg (has no administration in time range)  ?  Or  ?glycopyrrolate (ROBINUL) injection 0.2 mg (has no administration in time  range)  ?antiseptic oral rinse (BIOTENE) solution 15 mL (has no administration in time range)  ?polyvinyl alcohol (LIQUIFILM TEARS) 1.4 % ophthalmic solution 1 drop (has no administration in time range)  ?morphine bolus via infusion 1 mg (has no administration in time range)  ?morphine 100mg  in NS 100mL (1mg /mL) infusion - premix (5 mg/hr Intravenous Rate/Dose Change 07-21-2022 1533)  ?lactated ringers bolus 1,000 mL (0 mLs Intravenous Stopped 07-21-2022 1152)  ?  And  ?lactated ringers bolus 500 mL (0 mLs Intravenous Stopped 07-21-2022 1152)  ?ceFEPIme (MAXIPIME) 2 g in sodium chloride 0.9 % 100 mL IVPB (0 g Intravenous Stopped 07-21-2022 1121)  ?metroNIDAZOLE (FLAGYL) IVPB 500 mg (0 mg Intravenous Stopped 07-21-2022 1216)  ?vancomycin (VANCOCIN) IVPB 1000 mg/200 mL premix (0 mg Intravenous Stopped 07-21-2022 1234)  ? ?IMPRESSION / MDM / ASSESSMENT AND PLAN / ED COURSE  ?I reviewed the triage vital signs and the nursing notes. ?             ?               ?Differential diagnosis includes, but is not limited to, Pneumonia, COPD exacerbation, CHF exacerbation, sepsis ?The patient is on the cardiac monitor to evaluate for evidence of  arrhythmia and/or significant heart rate changes. ?Patient 81 year old female who presents in respiratory distress secondary to low likely COPD exacerbation.  Patient has inspiratory and expiratory wheezes

## 2021-11-02 DEATH — deceased

## 2022-01-26 IMAGING — US US EXTREM LOW VENOUS
1 series · 14 of 24 positions shown · non-contrast
Comparison: None.

CLINICAL DATA: Bilateral lower extremity edema

EXAM:
BILATERAL LOWER EXTREMITY VENOUS DOPPLER ULTRASOUND
TECHNIQUE: Gray-scale sonography with compression, as well as color and duplex
ultrasound, were performed to evaluate the deep venous system(s)
from the level of the common femoral vein through the popliteal and
proximal calf veins.

[Series 1: us venous img lower bilat (dvt) · portal-venous · 14 of 57 slices shown]
[im 1/57]
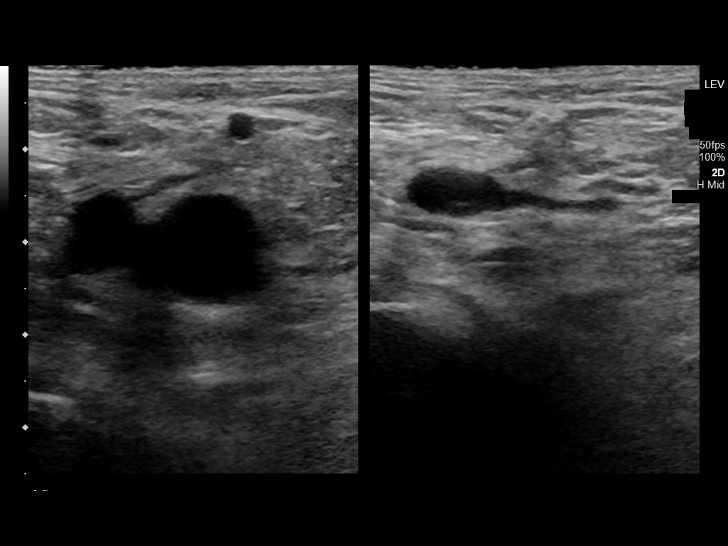
[im 5/57]
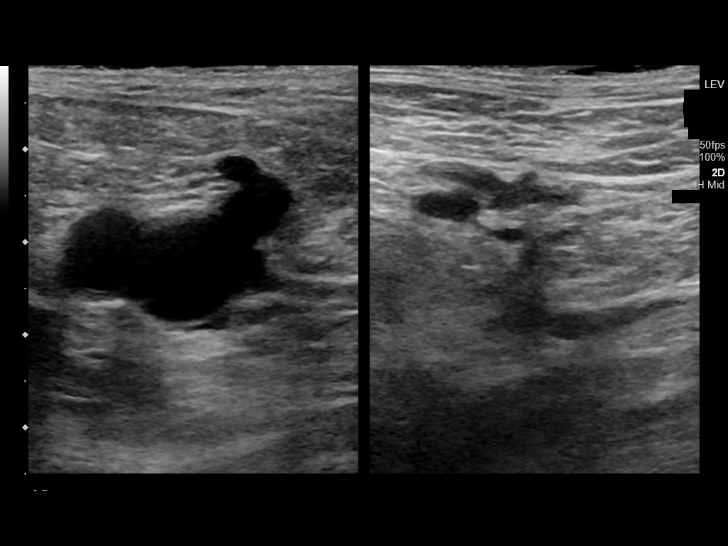
[im 10/57]
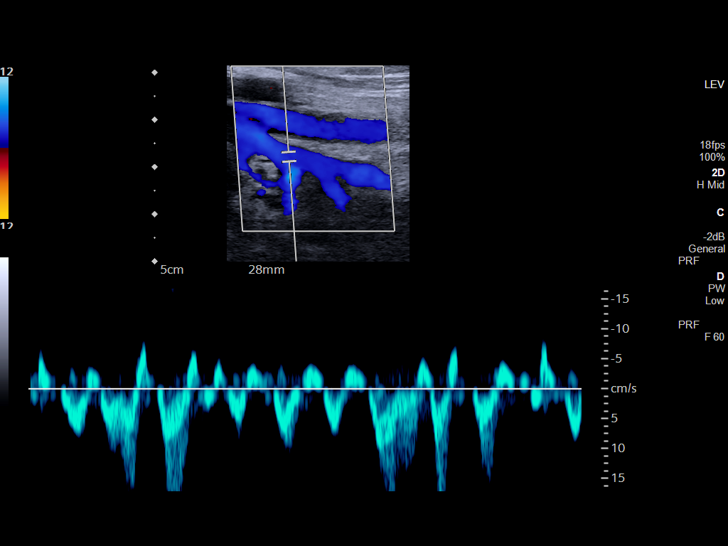
[im 15/57]
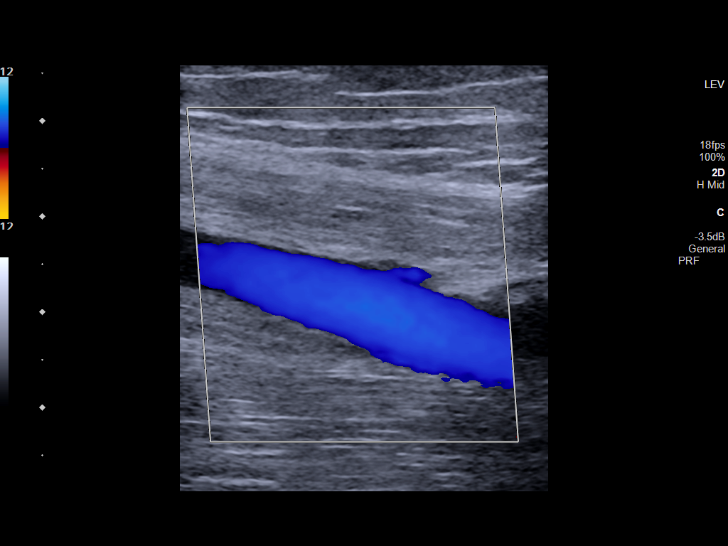
[im 18/57]
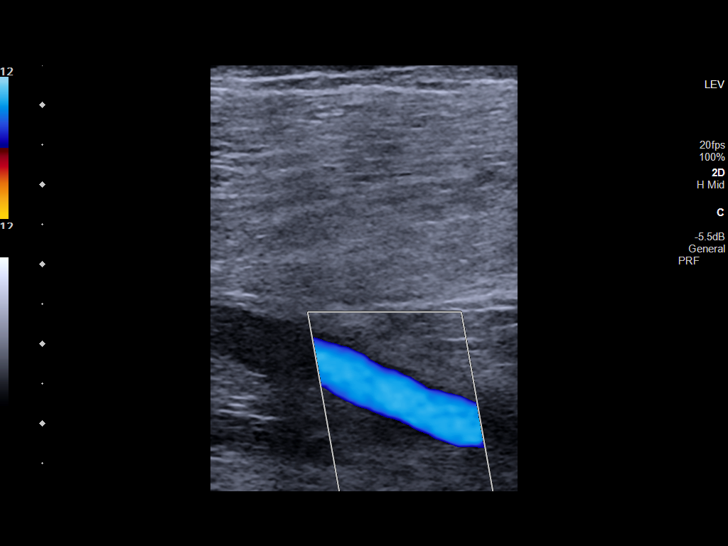
[im 22/57]
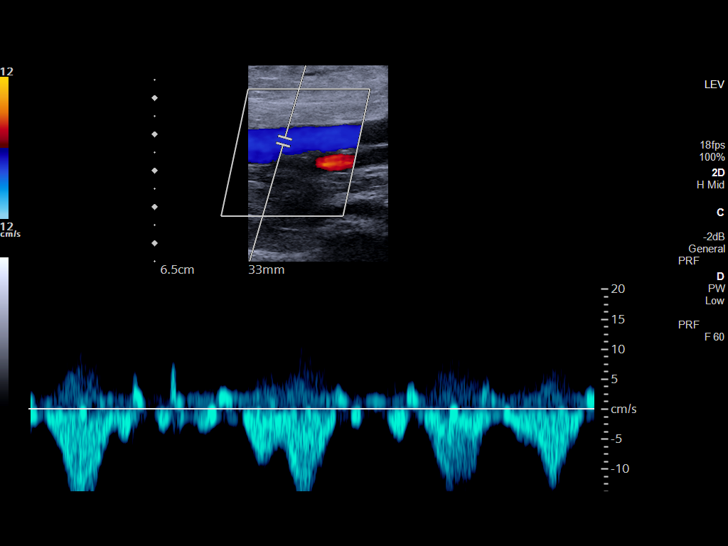
[im 27/57]
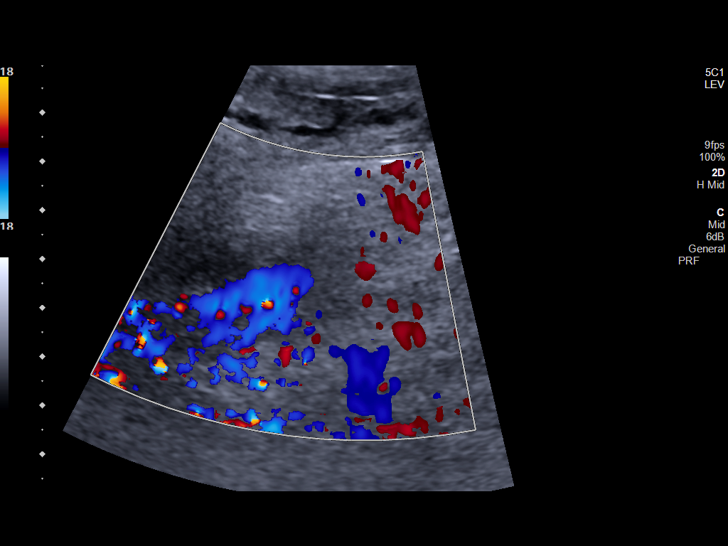
[im 30/57]
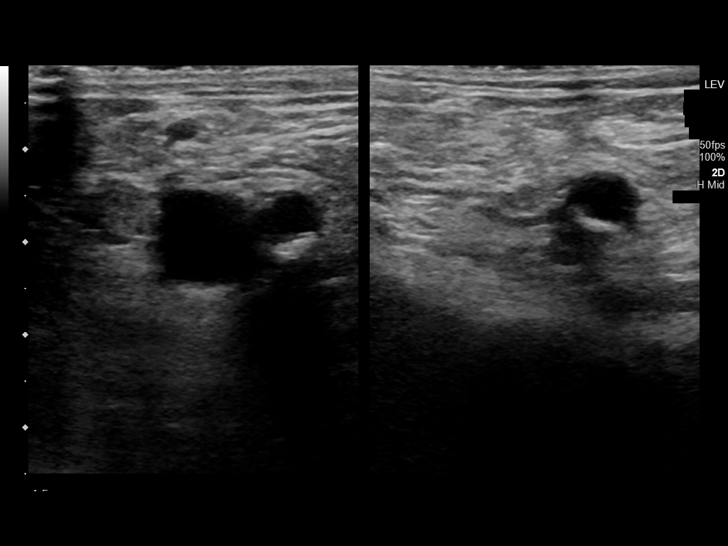
[im 35/57]
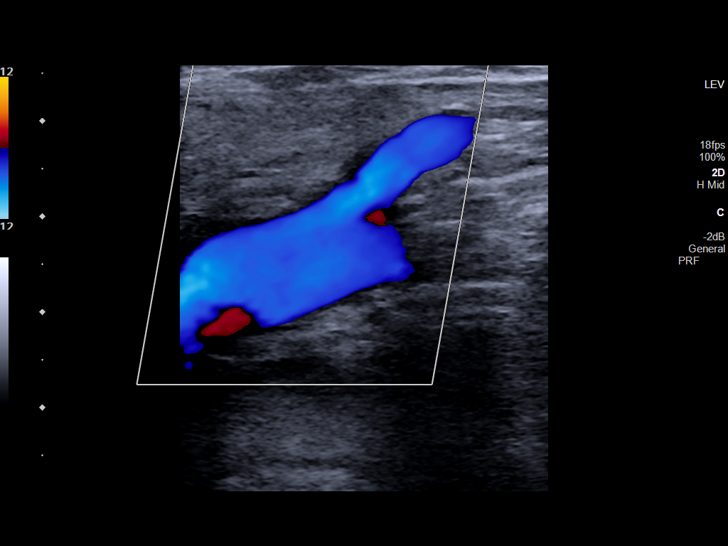
[im 39/57]
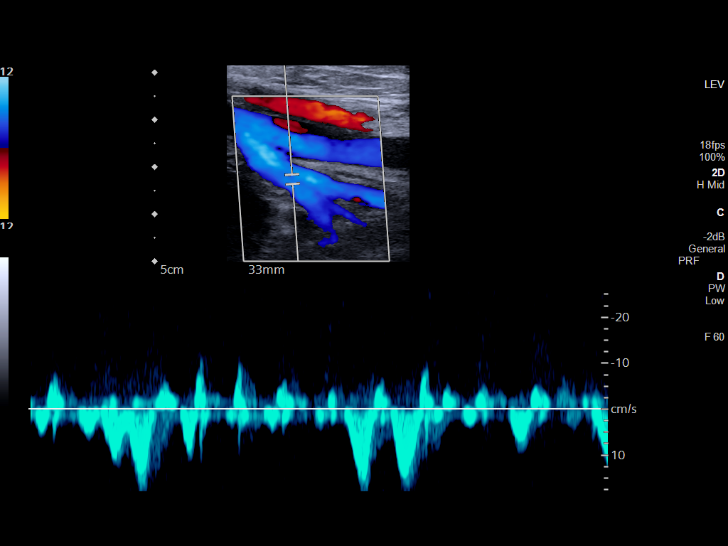
[im 44/57]
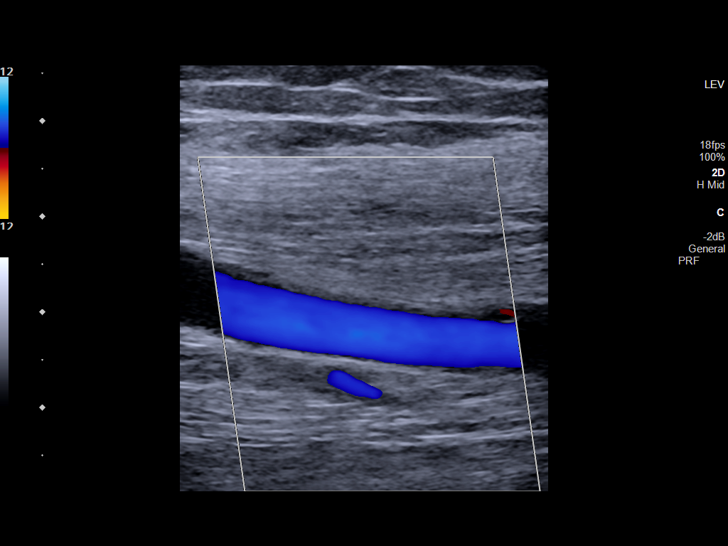
[im 47/57]
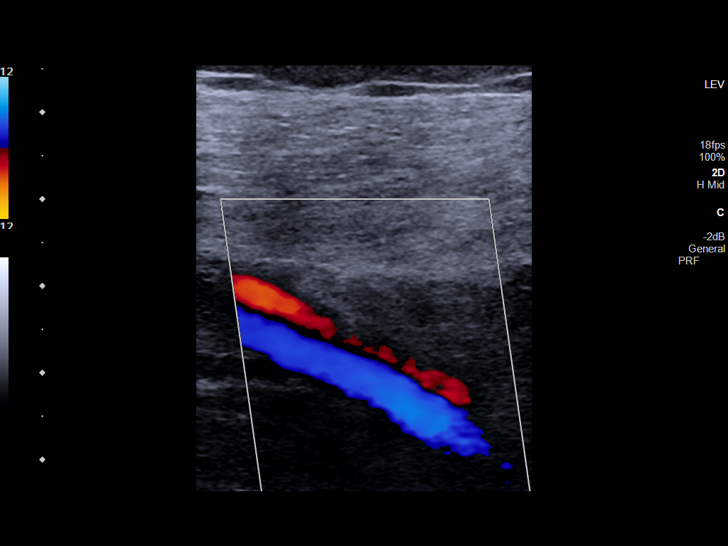
[im 52/57]
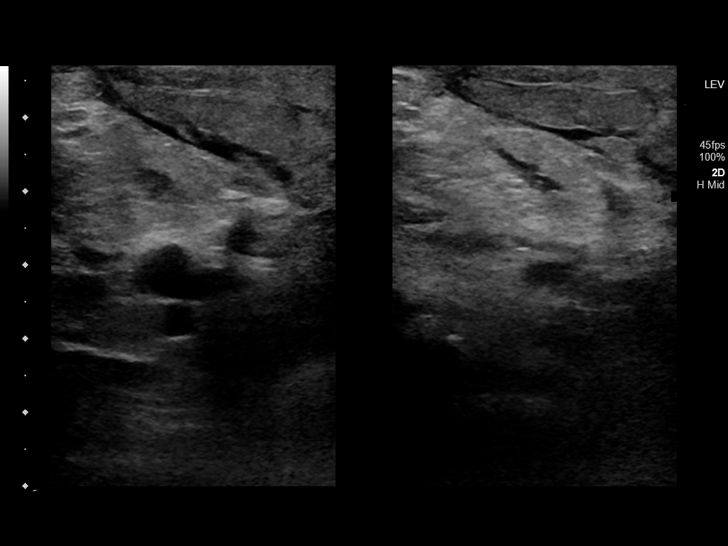
[im 57/57]
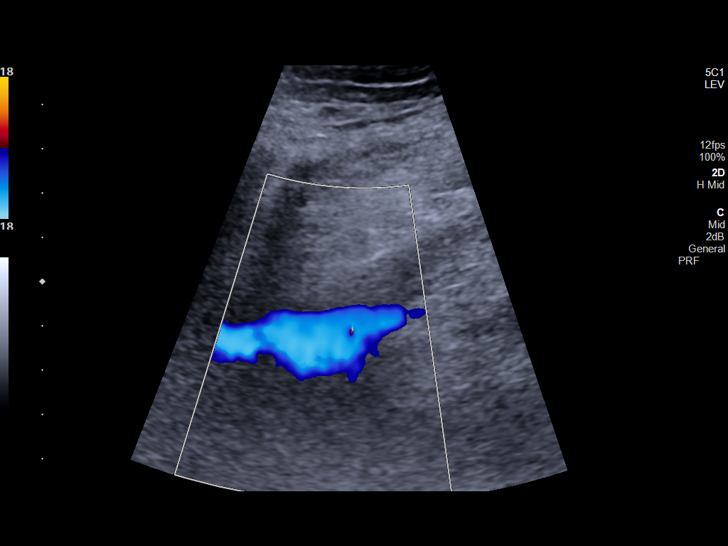

[14 of 24 positions shown; findings below may reference images not displayed]

FINDINGS: VENOUS

Normal compressibility of the common femoral, superficial femoral,
and popliteal veins, as well as the visualized calf veins.
Visualized portions of profunda femoral vein and great saphenous
vein unremarkable. No filling defects to suggest DVT on grayscale or
color Doppler imaging. Doppler waveforms show normal direction of
venous flow, normal respiratory plasticity and response to
augmentation. The phasicity bilaterally is somewhat hyperdynamic.

OTHER

Nonspecific lower extremity edema is noted.

Limitations: none
IMPRESSION: 1. Above no acute DVT.
2. There is nonspecific bilateral lower extremity edema.
3. The venous phasicity is somewhat hyperdynamic bilaterally which
can be seen in patients with right-sided heart failure.

## 2023-05-13 IMAGING — DX DG CHEST 1V PORT
1 series · 1 of 1 positions shown · non-contrast
Comparison: 01/03/2015

CLINICAL DATA: Questionable sepsis

EXAM:
PORTABLE CHEST 1 VIEW

[chest ap]
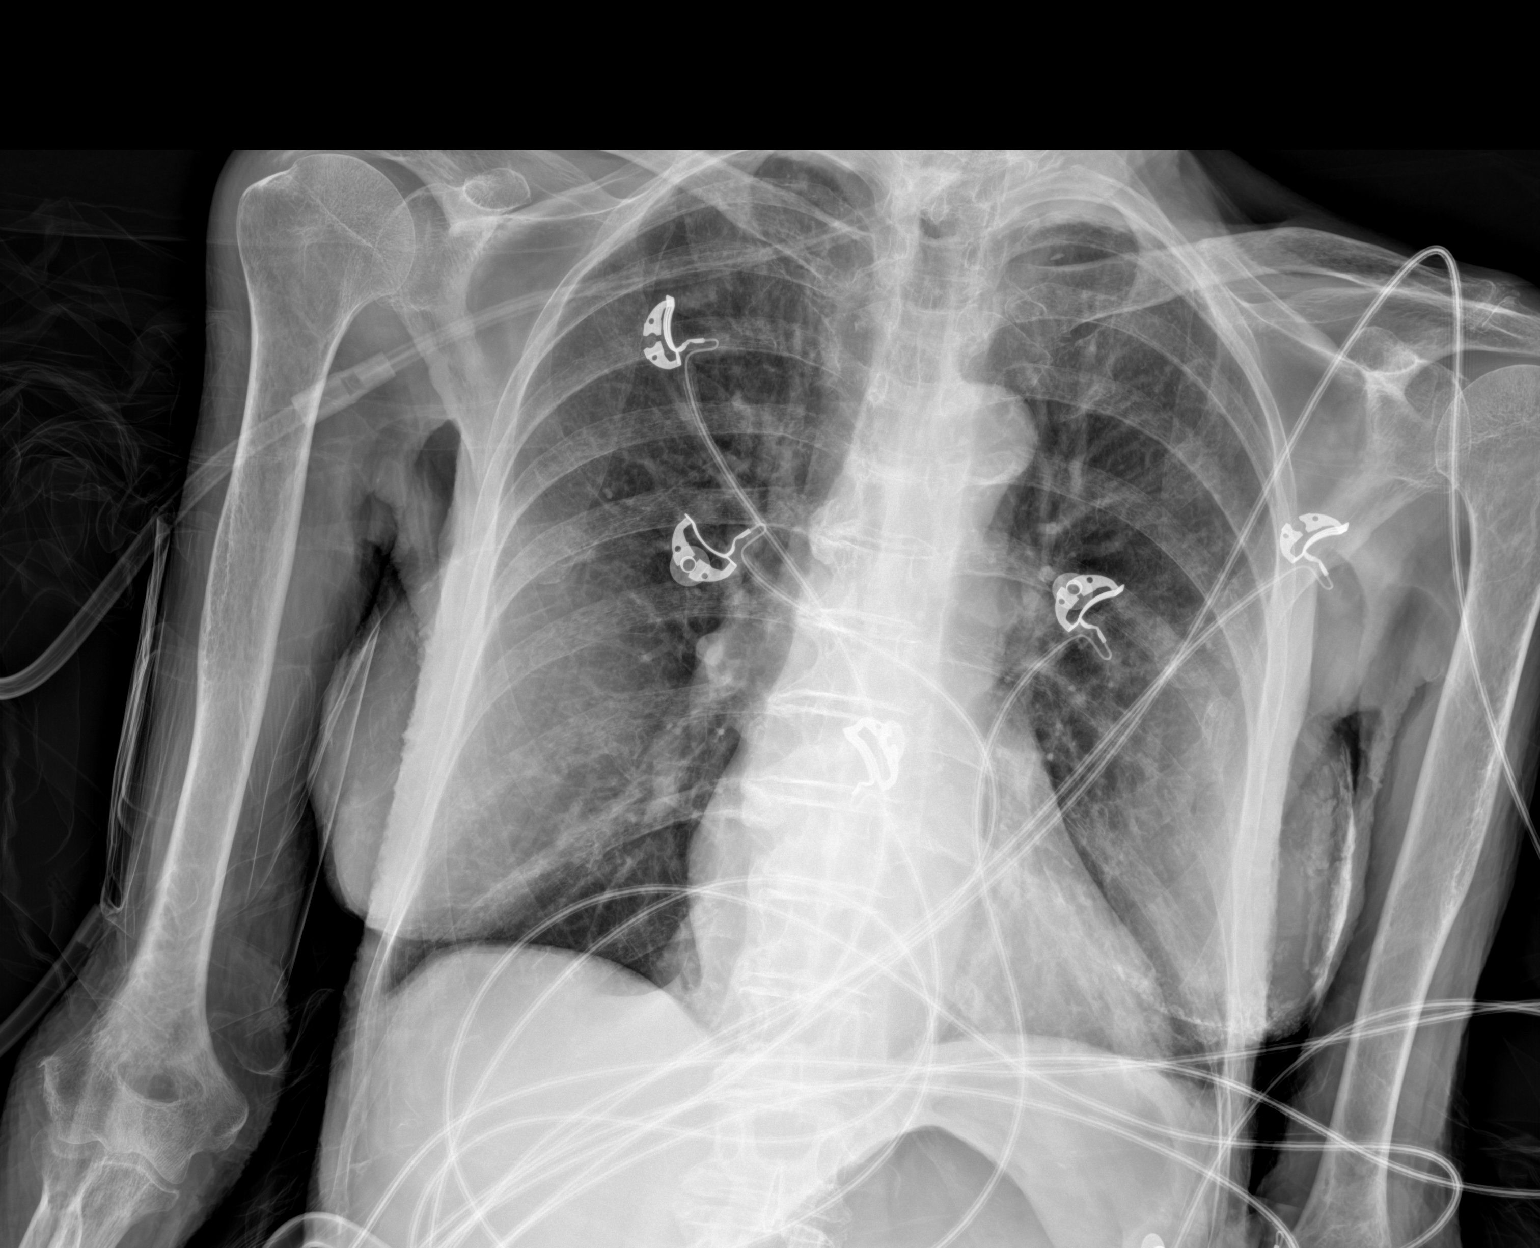

[1 of 1 positions shown; findings below may reference images not displayed]

FINDINGS: Artifact from EKG leads and calcified breast implants.

There is no edema, consolidation, effusion, or pneumothorax. Chronic
biapical pleural thickening asymmetric to the left. Normal heart
size and mediastinal contours.
IMPRESSION: No evidence of active disease.
